# Patient Record
Sex: Female | Born: 2000 | Race: Black or African American | Hispanic: No | Marital: Single
Health system: Midwestern US, Community
[De-identification: ages and names within clinical notes are randomized; demographics above are authoritative.]

## PROBLEM LIST (undated history)

## (undated) DIAGNOSIS — F32A Depression, unspecified: Secondary | ICD-10-CM

## (undated) DIAGNOSIS — R519 Headache, unspecified: Secondary | ICD-10-CM

## (undated) DIAGNOSIS — F419 Anxiety disorder, unspecified: Secondary | ICD-10-CM

## (undated) HISTORY — PX: NO PAST SURGERIES: SHX2092

## (undated) HISTORY — DX: Headache, unspecified: R51.9

## (undated) HISTORY — DX: Depression, unspecified: F32.A

## (undated) HISTORY — DX: Anxiety disorder, unspecified: F41.9

---

## 2019-09-20 ENCOUNTER — Inpatient Hospital Stay
Admit: 2019-09-20 | Discharge: 2019-09-21 | Disposition: A | Discharge status: Home / Self Care | Payer: BLUE CROSS/BLUE SHIELD | Attending: Emergency Medicine

## 2019-09-20 DIAGNOSIS — N3000 Acute cystitis without hematuria: Secondary | ICD-10-CM

## 2019-09-20 LAB — COMPREHENSIVE METABOLIC PANEL
ALT: 20 U/L (ref 12–65)
AST: 27 U/L (ref 15–37)
Albumin/Globulin Ratio: 0.8 — ABNORMAL LOW (ref 1.2–3.5)
Albumin: 3.2 g/dL — ABNORMAL LOW (ref 3.5–5.0)
Alkaline Phosphatase: 38 U/L — ABNORMAL LOW (ref 50–136)
Anion Gap: 9 mmol/L (ref 7–16)
BUN: 7 MG/DL (ref 6–23)
CO2: 20 mmol/L — ABNORMAL LOW (ref 21–32)
Calcium: 9 MG/DL (ref 8.3–10.4)
Chloride: 112 mmol/L — ABNORMAL HIGH (ref 98–107)
Creatinine: 0.86 MG/DL (ref 0.6–1.0)
EGFR IF NonAfrican American: >60 mL/min/{1.73_m2} (ref >60)
GFR African American: >60 mL/min/{1.73_m2} (ref >60)
Globulin: 4 g/dL — ABNORMAL HIGH (ref 2.3–3.5)
Glucose: 78 mg/dL (ref 65–100)
Potassium: 4.3 mmol/L (ref 3.5–5.1)
Sodium: 141 mmol/L (ref 138–145)
Total Bilirubin: 0.5 MG/DL (ref 0.2–1.1)
Total Protein: 7.2 g/dL (ref 6.3–8.2)

## 2019-09-20 LAB — CBC WITH AUTO DIFFERENTIAL
Basophils %: 0 % (ref 0.0–2.0)
Basophils Absolute: 0.1 10*3/uL (ref 0.0–0.2)
Eosinophils %: 0 % — ABNORMAL LOW (ref 0.5–7.8)
Eosinophils Absolute: 0.1 10*3/uL (ref 0.0–0.8)
Granulocyte Absolute Count: 0 10*3/uL (ref 0.0–0.5)
Hematocrit: 38.4 % (ref 35.8–46.3)
Hemoglobin: 12.5 g/dL (ref 11.7–15.4)
Immature Granulocytes: 0 % (ref 0.0–5.0)
Lymphocytes %: 20 % (ref 13–44)
Lymphocytes Absolute: 2.5 10*3/uL (ref 0.5–4.6)
MCH: 29.5 PG (ref 26.1–32.9)
MCHC: 32.6 g/dL (ref 31.4–35.0)
MCV: 90.6 FL (ref 79.6–97.8)
MPV: 10.9 FL (ref 9.4–12.3)
Monocytes %: 6 % (ref 4.0–12.0)
Monocytes Absolute: 0.8 10*3/uL (ref 0.1–1.3)
NRBC Absolute: 0 10*3/uL (ref 0.0–0.2)
Neutrophils %: 72 % (ref 43–78)
Neutrophils Absolute: 8.7 10*3/uL — ABNORMAL HIGH (ref 1.7–8.2)
Platelets: 255 10*3/uL (ref 150–450)
RBC: 4.24 M/uL (ref 4.05–5.2)
RDW: 13.6 % (ref 11.9–14.6)
WBC: 12.1 10*3/uL — ABNORMAL HIGH (ref 4.3–11.1)

## 2019-09-20 LAB — URINALYSIS W/ RFLX MICROSCOPIC
Bilirubin, Urine: NEGATIVE
Bilirubin: NEGATIVE
Blood, Urine: NEGATIVE
Blood: NEGATIVE
Glucose, Ur: NEGATIVE mg/dL
Glucose: NEGATIVE mg/dL
Ketone: 40 mg/dL — AB
Ketones, Urine: 40 mg/dL — AB
Nitrite, Urine: NEGATIVE
Nitrites: NEGATIVE
Specific Gravity, UA: 1.029 — ABNORMAL HIGH (ref 1.001–1.023)
Specific gravity: 1.029 — ABNORMAL HIGH (ref 1.001–1.023)
Urobilinogen, UA, POCT: 1 EU/dL (ref 0.2–1.0)
Urobilinogen: 1 EU/dL (ref 0.2–1.0)
pH (UA): 6 (ref 5.0–9.0)
pH, UA: 6 (ref 5.0–9.0)

## 2019-09-20 LAB — HCG URINE, QL. - POC
HCG, Pregnancy, Urine, POC: NEGATIVE
Pregnancy test,urine (POC): NEGATIVE

## 2019-09-20 LAB — CBC WITH AUTOMATED DIFF
ABS. BASOPHILS: 0.1 10*3/uL (ref 0.0–0.2)
ABS. EOSINOPHILS: 0.1 10*3/uL (ref 0.0–0.8)
ABS. IMM. GRANS.: 0 10*3/uL (ref 0.0–0.5)
ABS. LYMPHOCYTES: 2.5 10*3/uL (ref 0.5–4.6)
ABS. MONOCYTES: 0.8 10*3/uL (ref 0.1–1.3)
ABS. NEUTROPHILS: 8.7 10*3/uL — ABNORMAL HIGH (ref 1.7–8.2)
ABSOLUTE NRBC: 0 10*3/uL (ref 0.0–0.2)
BASOPHILS: 0 % (ref 0.0–2.0)
EOSINOPHILS: 0 % — ABNORMAL LOW (ref 0.5–7.8)
HCT: 38.4 % (ref 35.8–46.3)
HGB: 12.5 g/dL (ref 11.7–15.4)
IMMATURE GRANULOCYTES: 0 % (ref 0.0–5.0)
LYMPHOCYTES: 20 % (ref 13–44)
MCH: 29.5 PG (ref 26.1–32.9)
MCHC: 32.6 g/dL (ref 31.4–35.0)
MCV: 90.6 FL (ref 79.6–97.8)
MONOCYTES: 6 % (ref 4.0–12.0)
MPV: 10.9 FL (ref 9.4–12.3)
NEUTROPHILS: 72 % (ref 43–78)
PLATELET: 255 10*3/uL (ref 150–450)
RBC: 4.24 M/uL (ref 4.05–5.2)
RDW: 13.6 % (ref 11.9–14.6)
WBC: 12.1 10*3/uL — ABNORMAL HIGH (ref 4.3–11.1)

## 2019-09-20 LAB — METABOLIC PANEL, COMPREHENSIVE
A-G Ratio: 0.8 — ABNORMAL LOW (ref 1.2–3.5)
ALT (SGPT): 20 U/L (ref 12–65)
AST (SGOT): 27 U/L (ref 15–37)
Albumin: 3.2 g/dL — ABNORMAL LOW (ref 3.5–5.0)
Alk. phosphatase: 38 U/L — ABNORMAL LOW (ref 50–136)
Anion gap: 9 mmol/L (ref 7–16)
BUN: 7 MG/DL (ref 6–23)
Bilirubin, total: 0.5 MG/DL (ref 0.2–1.1)
CO2: 20 mmol/L — ABNORMAL LOW (ref 21–32)
Calcium: 9 MG/DL (ref 8.3–10.4)
Chloride: 112 mmol/L — ABNORMAL HIGH (ref 98–107)
Creatinine: 0.86 MG/DL (ref 0.6–1.0)
GFR est AA: >60 mL/min/{1.73_m2} (ref >60)
GFR est non-AA: >60 mL/min/{1.73_m2} (ref >60)
Globulin: 4 g/dL — ABNORMAL HIGH (ref 2.3–3.5)
Glucose: 78 mg/dL (ref 65–100)
Potassium: 4.3 mmol/L (ref 3.5–5.1)
Protein, total: 7.2 g/dL (ref 6.3–8.2)
Sodium: 141 mmol/L (ref 138–145)

## 2019-09-20 MED ORDER — AZITHROMYCIN 250 MG TAB
250 mg | ORAL | Status: AC
Start: 2019-09-20 — End: 2019-09-20
  Administered 2019-09-20: via ORAL

## 2019-09-20 MED ORDER — LIDOCAINE HCL 1 % (10 MG/ML) IJ SOLN
10 mg/mL (1 %) | Freq: Once | INTRAMUSCULAR | Status: AC
Start: 2019-09-20 — End: 2019-09-20
  Administered 2019-09-20: via INTRAMUSCULAR

## 2019-09-20 MED ORDER — LIDOCAINE HCL 1 % (10 MG/ML) IJ SOLN
101 mg/mL (1 %) | Freq: Once | INTRAMUSCULAR | Status: DC
Start: 2019-09-20 — End: 2019-09-20

## 2019-09-20 MED FILL — AZITHROMYCIN 250 MG TAB: 250 mg | ORAL | Qty: 4

## 2019-09-20 MED FILL — CEFTRIAXONE 250 MG SOLUTION FOR INJECTION: 250 mg | INTRAMUSCULAR | Qty: 250
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
## 2019-09-20 NOTE — Progress Notes (Signed)
Placed on Keflex.

## 2019-09-20 NOTE — ED Provider Notes (Signed)
ED Provider Notes by Cicero Duck, MD at 09/20/19 1806                Author: Alphonzo Lemmings Rayvon Char, MD  Service: Emergency Medicine  Author Type: Physician       Filed: 09/20/19 1938  Date of Service: 09/20/19 1806  Status: Signed          Editor: Shaynah Hund, Rayvon Char, MD (Physician)               Patient has no significant past medical history, states she has been having abdominal pain for the past 3 days.   She describes it as constant lower nonradiating and worse when she ambulates.  She states she had a call into work today because she was hurting too badly.  She denies any nausea or vomiting, states she has been eating and drinking without difficulty  though she has had a decreased appetite.  She denies any fever, denies any urinary symptoms.  She denies any abnormal vaginal discharge.  She denies similar symptoms in the past, has not taken any medicine for her symptoms.                No past medical history on file.      No past surgical history on file.        No family history on file.        Social History          Socioeconomic History         ?  Marital status:  Not on file              Spouse name:  Not on file         ?  Number of children:  Not on file     ?  Years of education:  Not on file     ?  Highest education level:  Not on file       Occupational History        ?  Not on file       Social Needs         ?  Financial resource strain:  Not on file        ?  Food insecurity              Worry:  Not on file         Inability:  Not on file        ?  Transportation needs              Medical:  Not on file         Non-medical:  Not on file       Tobacco Use         ?  Smoking status:  Not on file       Substance and Sexual Activity         ?  Alcohol use:  Not on file     ?  Drug use:  Not on file     ?  Sexual activity:  Not on file       Lifestyle        ?  Physical activity              Days per week:  Not on file         Minutes per session:  Not on file         ?  Stress:  Not on file        Relationships        ?  Social Engineer, manufacturing systemsconnections              Talks on phone:  Not on file         Gets together:  Not on file         Attends religious service:  Not on file         Active member of club or organization:  Not on file              Attends meetings of clubs or organizations:  Not on file              Relationship status:  Not on file        ?  Intimate partner violence              Fear of current or ex partner:  Not on file         Emotionally abused:  Not on file         Physically abused:  Not on file         Forced sexual activity:  Not on file        Other Topics  Concern        ?  Not on file       Social History Narrative        ?  Not on file              ALLERGIES: Patient has no known allergies.      Review of Systems    Constitutional: Negative for chills and fever.    Gastrointestinal: Negative for nausea and vomiting.    All other systems reviewed and are negative.           Vitals:          09/20/19 1621        BP:  134/77     Pulse:  73     Resp:  16     Temp:  98.1 ??F (36.7 ??C)     SpO2:  98%     Weight:  94.8 kg (209 lb)        Height:  5\' 6"  (1.676 m)                Physical Exam   Vitals signs and nursing note reviewed.   Constitutional:        Appearance: Normal appearance. She is well-developed. She is obese.   HENT:       Head: Normocephalic and atraumatic.   Eyes :       Conjunctiva/sclera: Conjunctivae normal.      Pupils: Pupils are equal, round, and reactive to light.    Neck:       Musculoskeletal: Normal range of motion and neck supple.   Pulmonary:       Effort: Pulmonary effort is normal. No respiratory distress.   Abdominal:      General: Bowel sounds are normal.      Palpations: Abdomen is  soft.      Tenderness: There is abdominal tenderness.            Comments: Mild tenderness to palpation lower abdomen as indicated     Musculoskeletal:          General: No tenderness.    Skin:      General: Skin is warm and dry.  Neurological :       Mental Status: She is alert and  oriented to person, place, and time.    Psychiatric:         Behavior: Behavior normal.              MDM   Number of Diagnoses or Management Options   Acute cystitis without hematuria: new and does not require workup   BV (bacterial vaginosis): new and does not require workup   Diagnosis management comments: 7:38 PM discussed results with patient, need for antibiotics.  Repeat abdominal exam is benign and she appears comfortable.          Amount and/or Complexity of Data Reviewed   Clinical lab tests: ordered and reviewed      Risk of Complications, Morbidity, and/or Mortality   Presenting problems: moderate  Diagnostic procedures: low  Management options: low     Patient Progress   Patient progress: stable             Procedures

## 2019-09-20 NOTE — ED Notes (Signed)
I have reviewed discharge instructions with the patient.  The patient verbalized understanding.    Patient left ED via Discharge Method: ambulatory to Home with (friend).    Opportunity for questions and clarification provided.       Patient given 2 scripts.     No e-sign.    To continue your aftercare when you leave the hospital, you may receive an automated call from our care team to check in on how you are doing.  This is a free service and part of our promise to provide the best care and service to meet your aftercare needs.??? If you have questions, or wish to unsubscribe from this service please call [phone].  Thank you for Choosing our [place] [facility].

## 2019-09-20 NOTE — ED Notes (Signed)
I have reviewed discharge instructions with the patient.  The patient verbalized understanding.    Patient left ED via Discharge Method: ambulatory to Home with friend    Opportunity for questions and clarification provided.       Patient given 2 scripts. No esign        To continue your aftercare when you leave the hospital, you may receive an automated call from our care team to check in on how you are doing.  This is a free service and part of our promise to provide the best care and service to meet your aftercare needs." If you have questions, or wish to unsubscribe from this service please call [phone].  Thank you for Choosing our [facility].

## 2019-09-20 NOTE — ED Notes (Signed)
Ambulatory to triage wearing mask.  C/o lower abdominal pain for the last 2 days.  1 episode of diarrhea yesterday, no nausea, no vomiting.  No currently on menstrual cycle.

## 2019-09-20 NOTE — ED Provider Notes (Signed)
Patient has no significant past medical history, states she has been having abdominal pain for the past 3 days.  She describes it as constant lower nonradiating and worse when she ambulates.  She states she had a call into work today because she was hurting too badly.  She denies any nausea or vomiting, states she has been eating and drinking without difficulty though she has had a decreased appetite.  She denies any fever, denies any urinary symptoms.  She denies any abnormal vaginal discharge.  She denies similar symptoms in the past, has not taken any medicine for her symptoms.           No past medical history on file.    No past surgical history on file.      No family history on file.    Social History     Socioeconomic History   ??? Marital status: Not on file     Spouse name: Not on file   ??? Number of children: Not on file   ??? Years of education: Not on file   ??? Highest education level: Not on file   Occupational History   ??? Not on file   Social Needs   ??? Financial resource strain: Not on file   ??? Food insecurity     Worry: Not on file     Inability: Not on file   ??? Transportation needs     Medical: Not on file     Non-medical: Not on file   Tobacco Use   ??? Smoking status: Not on file   Substance and Sexual Activity   ??? Alcohol use: Not on file   ??? Drug use: Not on file   ??? Sexual activity: Not on file   Lifestyle   ??? Physical activity     Days per week: Not on file     Minutes per session: Not on file   ??? Stress: Not on file   Relationships   ??? Social Product manager on phone: Not on file     Gets together: Not on file     Attends religious service: Not on file     Active member of club or organization: Not on file     Attends meetings of clubs or organizations: Not on file     Relationship status: Not on file   ??? Intimate partner violence     Fear of current or ex partner: Not on file     Emotionally abused: Not on file     Physically abused: Not on file     Forced sexual activity: Not on file    Other Topics Concern   ??? Not on file   Social History Narrative   ??? Not on file         ALLERGIES: Patient has no known allergies.    Review of Systems   Constitutional: Negative for chills and fever.   Gastrointestinal: Negative for nausea and vomiting.   All other systems reviewed and are negative.      Vitals:    09/20/19 1621   BP: 134/77   Pulse: 73   Resp: 16   Temp: 98.1 ??F (36.7 ??C)   SpO2: 98%   Weight: 94.8 kg (209 lb)   Height: 5\' 6"  (1.676 m)            Physical Exam  Vitals signs and nursing note reviewed.   Constitutional:       Appearance:  Normal appearance. She is well-developed. She is obese.   HENT:      Head: Normocephalic and atraumatic.   Eyes:      Conjunctiva/sclera: Conjunctivae normal.      Pupils: Pupils are equal, round, and reactive to light.   Neck:      Musculoskeletal: Normal range of motion and neck supple.   Pulmonary:      Effort: Pulmonary effort is normal. No respiratory distress.   Abdominal:      General: Bowel sounds are normal.      Palpations: Abdomen is soft.      Tenderness: There is abdominal tenderness.          Comments: Mild tenderness to palpation lower abdomen as indicated   Musculoskeletal:         General: No tenderness.   Skin:     General: Skin is warm and dry.   Neurological:      Mental Status: She is alert and oriented to person, place, and time.   Psychiatric:         Behavior: Behavior normal.          MDM  Number of Diagnoses or Management Options  Acute cystitis without hematuria: new and does not require workup  BV (bacterial vaginosis): new and does not require workup  Diagnosis management comments: 7:38 PM discussed results with patient, need for antibiotics.  Repeat abdominal exam is benign and she appears comfortable.       Amount and/or Complexity of Data Reviewed  Clinical lab tests: ordered and reviewed    Risk of Complications, Morbidity, and/or Mortality  Presenting problems: moderate  Diagnostic procedures: low  Management options: low     Patient Progress  Patient progress: stable         Procedures

## 2019-09-20 NOTE — ED Triage Notes (Signed)
Ambulatory to triage wearing mask.  C/o lower abdominal pain for the last 2 days.  1 episode of diarrhea yesterday, no nausea, no vomiting.  No currently on menstrual cycle.

## 2019-09-21 LAB — WET PREP
Wet Prep: >100
Wet Prep: NONE SEEN
Wet Prep: NONE SEEN
Wet prep: >100
Wet prep: NONE SEEN
Wet prep: NONE SEEN

## 2019-09-21 MED ORDER — CEPHALEXIN 500 MG CAP
500 mg | ORAL | Status: AC
Start: 2019-09-21 — End: 2019-09-20
  Administered 2019-09-21: 01:00:00 via ORAL

## 2019-09-21 MED ORDER — CEPHALEXIN 500 MG CAP
500 mg | ORAL_CAPSULE | Freq: Three times a day (TID) | ORAL | 0 refills | Status: AC
Start: 2019-09-21 — End: 2019-09-27

## 2019-09-21 MED ORDER — METRONIDAZOLE 500 MG TAB
500 mg | ORAL_TABLET | Freq: Two times a day (BID) | ORAL | 0 refills | Status: AC
Start: 2019-09-21 — End: 2019-09-27

## 2019-09-21 MED FILL — CEPHALEXIN 500 MG CAP: 500 mg | ORAL | Qty: 1

## 2019-09-22 LAB — CULTURE, URINE
Culture result:: >100000
Culture: >100000

## 2019-09-23 LAB — CHLAMYDIA / GC-AMPLIFIED
CHLAMYDIA TRACHOMATIS, NAA, 188078: NEGATIVE
Chlamydia trachomatis, NAA: NEGATIVE
NEISSERIA GONORRHOEAE, NAA, 188086: POSITIVE — AB
Neisseria gonorrhoeae, NAA: POSITIVE — AB

## 2019-10-26 DIAGNOSIS — S161XXA Strain of muscle, fascia and tendon at neck level, initial encounter: Secondary | ICD-10-CM
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
## 2019-10-26 NOTE — ED Notes (Signed)
I have reviewed discharge instructions with the patient.  The patient verbalized understanding.    Patient left ED via Discharge Method: ambulatory to Home with (insert name of family/friend, self, transport FRIEND).    Opportunity for questions and clarification provided.       Patient given 2 scripts.         To continue your aftercare when you leave the hospital, you may receive an automated call from our care team to check in on how you are doing. This is a free service and part of our promise to provide the best care and service to meet your aftercare needs." If you have questions, or wish to unsubscribe from this service please call [phone]. Thank you for Choosing our [facility] Emergency Department.
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
## 2019-10-26 NOTE — ED Notes (Signed)
I have reviewed discharge instructions with the patient.  The patient verbalized understanding.    Patient left ED via Discharge Method: ambulatory to Home with (insert name of family/friend, self, transport FRIEND).    Opportunity for questions and clarification provided.       Patient given 2 scripts.         To continue your aftercare when you leave the hospital, you may receive an automated call from our care team to check in on how you are doing.?? This is a free service and part of our promise to provide the best care and service to meet your aftercare needs." If you have questions, or wish to unsubscribe from this service please call [phone].?? Thank you for Choosing our [place] [facility].

## 2019-10-27 ENCOUNTER — Inpatient Hospital Stay
Admit: 2019-10-27 | Discharge: 2019-10-27 | Disposition: A | Discharge status: Home / Self Care | Attending: Emergency Medicine

## 2019-10-27 MED ORDER — IBUPROFEN 600 MG TAB
600 mg | ORAL_TABLET | Freq: Four times a day (QID) | ORAL | 0 refills | Status: AC | PRN
Start: 2019-10-27 — End: ?

## 2019-10-27 MED ORDER — METHOCARBAMOL 500 MG TAB
500 mg | ORAL_TABLET | Freq: Three times a day (TID) | ORAL | 0 refills | Status: AC
Start: 2019-10-27 — End: 2019-11-02

## 2019-10-31 ENCOUNTER — Emergency Department: Admit: 2019-10-31 | Primary: Family Medicine

## 2019-10-31 ENCOUNTER — Inpatient Hospital Stay
Admit: 2019-10-31 | Discharge: 2019-10-31 | Disposition: A | Discharge status: Home / Self Care | Attending: Emergency Medicine

## 2019-10-31 DIAGNOSIS — S63501A Unspecified sprain of right wrist, initial encounter: Secondary | ICD-10-CM
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
## 2020-03-20 ENCOUNTER — Inpatient Hospital Stay
Admit: 2020-03-20 | Discharge: 2020-03-20 | Disposition: A | Discharge status: Home / Self Care | Attending: Emergency Medicine

## 2020-03-20 ENCOUNTER — Emergency Department: Admit: 2020-03-20 | Primary: Family Medicine

## 2020-03-20 DIAGNOSIS — L0501 Pilonidal cyst with abscess: Secondary | ICD-10-CM

## 2020-03-20 MED ORDER — LIDOCAINE HCL 1 % (10 MG/ML) IJ SOLN
10 mg/mL (1 %) | Freq: Once | INTRAMUSCULAR | Status: DC
Start: 2020-03-20 — End: 2020-03-20

## 2020-03-20 MED ORDER — HYDROCODONE-ACETAMINOPHEN 7.5 MG-325 MG TAB
ORAL | Status: AC
Start: 2020-03-20 — End: 2020-03-20
  Administered 2020-03-20: 18:00:00 via ORAL

## 2020-03-20 MED ORDER — AMOXICILLIN CLAVULANATE 875 MG-125 MG TAB
875-125 mg | ORAL_TABLET | Freq: Two times a day (BID) | ORAL | 0 refills | Status: AC
Start: 2020-03-20 — End: 2020-03-27

## 2020-03-20 MED ORDER — LIDOCAINE HCL 1 % (10 MG/ML) IJ SOLN
10 mg/mL (1 %) | INTRAMUSCULAR | Status: DC
Start: 2020-03-20 — End: 2020-03-20

## 2020-03-20 MED FILL — HYDROCODONE-ACETAMINOPHEN 7.5 MG-325 MG TAB: ORAL | Qty: 1

## 2020-03-20 MED FILL — XYLOCAINE 10 MG/ML (1 %) INJECTION SOLUTION: 10 mg/mL (1 %) | INTRAMUSCULAR | Qty: 20

## 2020-03-20 NOTE — ED Notes (Signed)
I have reviewed discharge instructions with the patient.  The patient verbalized understanding.    Patient left ED via Discharge Method: ambulatory to Home with (insert name of family/friend, self, transport FRIEND).    Opportunity for questions and clarification provided.       Patient given 1 scripts.         To continue your aftercare when you leave the hospital, you may receive an automated call from our care team to check in on how you are doing. This is a free service and part of our promise to provide the best care and service to meet your aftercare needs." If you have questions, or wish to unsubscribe from this service please call [phone]. Thank you for Choosing our [facility] Emergency Department.

## 2020-03-20 NOTE — ED Notes (Signed)
I have reviewed discharge instructions with the patient.  The patient verbalized understanding.    Patient left ED via Discharge Method: ambulatory to Home with (insert name of family/friend, self, transport FRIEND).    Opportunity for questions and clarification provided.       Patient given 1 scripts.         To continue your aftercare when you leave the hospital, you may receive an automated call from our care team to check in on how you are doing.?? This is a free service and part of our promise to provide the best care and service to meet your aftercare needs." If you have questions, or wish to unsubscribe from this service please call [phone].?? Thank you for Choosing our [facility].
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
## 2020-03-20 NOTE — ED Provider Notes (Addendum)
19 year old female who presents to the emergency department today with complaint of sacral pain.  She reports pain began a couple of days ago and has progressively gotten worse.  She states that she thought she "dislocated her coccyx."  Patient denies any falls or trauma.  She denies any fever, fatigue, dysuria, hematuria, leg weakness, numbness, nausea, vomiting, diarrhea, pelvic pain, vaginal discharge, vaginal bleeding.    The history is provided by the patient.        No past medical history on file.    No past surgical history on file.      No family history on file.    Social History     Socioeconomic History   ??? Marital status: SINGLE     Spouse name: Not on file   ??? Number of children: Not on file   ??? Years of education: Not on file   ??? Highest education level: Not on file   Occupational History   ??? Not on file   Tobacco Use   ??? Smoking status: Not on file   Substance and Sexual Activity   ??? Alcohol use: Not on file   ??? Drug use: Not on file   ??? Sexual activity: Not on file   Other Topics Concern   ??? Not on file   Social History Narrative   ??? Not on file     Social Determinants of Health     Financial Resource Strain:    ??? Difficulty of Paying Living Expenses:    Food Insecurity:    ??? Worried About Programme researcher, broadcasting/film/video in the Last Year:    ??? Barista in the Last Year:    Transportation Needs:    ??? Freight forwarder (Medical):    ??? Lack of Transportation (Non-Medical):    Physical Activity:    ??? Days of Exercise per Week:    ??? Minutes of Exercise per Session:    Stress:    ??? Feeling of Stress :    Social Connections:    ??? Frequency of Communication with Friends and Family:    ??? Frequency of Social Gatherings with Friends and Family:    ??? Attends Religious Services:    ??? Database administrator or Organizations:    ??? Attends Engineer, structural:    ??? Marital Status:    Intimate Programme researcher, broadcasting/film/video Violence:    ??? Fear of Current or Ex-Partner:    ??? Emotionally Abused:    ??? Physically Abused:    ???  Sexually Abused:          ALLERGIES: Patient has no known allergies.    Review of Systems   Constitutional: Negative for chills, fatigue and fever.   Gastrointestinal: Negative for abdominal pain, diarrhea, nausea and vomiting.   Musculoskeletal: Positive for back pain.        Sacral pain   Neurological: Negative for dizziness, weakness and numbness.   All other systems reviewed and are negative.      Vitals:    03/20/20 1240   BP: (!) 101/57   Pulse: 88   Resp: 18   Temp: 98.9 ??F (37.2 ??C)   SpO2: 100%   Weight: 81.6 kg (180 lb)   Height: 5\' 6"  (1.676 m)            Physical Exam  Vitals and nursing note reviewed.   Constitutional:       General: She is not in acute distress.  Appearance: Normal appearance. She is normal weight. She is not ill-appearing, toxic-appearing or diaphoretic.   HENT:      Head: Normocephalic and atraumatic.      Right Ear: External ear normal.      Left Ear: External ear normal.      Nose: Nose normal. No congestion.      Mouth/Throat:      Mouth: Mucous membranes are moist.      Pharynx: Oropharynx is clear.   Eyes:      General: No scleral icterus.     Extraocular Movements: Extraocular movements intact.      Conjunctiva/sclera: Conjunctivae normal.   Cardiovascular:      Rate and Rhythm: Normal rate.      Pulses: Normal pulses.   Pulmonary:      Effort: Pulmonary effort is normal. No respiratory distress.   Abdominal:      General: Abdomen is flat. There is no distension.      Palpations: Abdomen is soft.      Tenderness: There is no abdominal tenderness.   Musculoskeletal:         General: Normal range of motion.      Cervical back: Normal range of motion and neck supple.   Skin:     General: Skin is warm and dry.      Capillary Refill: Capillary refill takes less than 2 seconds.      Findings: Abscess present.             Comments: Proximately 2 cm x 2 cm abscess to right gluteal cleft.  Center area of fluctuance.  No drainage, erythema noted.   Neurological:      General: No focal  deficit present.      Mental Status: She is alert and oriented to person, place, and time.   Psychiatric:         Mood and Affect: Mood normal.         Behavior: Behavior normal.         Thought Content: Thought content normal.         Judgment: Judgment normal.          MDM  Number of Diagnoses or Management Options  Pilonidal abscess  Diagnosis management comments: 19 year old female presented today for complaint of sacral pain.  Noted on exam approximately 2 cm round area to right gluteal cleft presumed to be pilonidal abscess.  Incision and drainage of abscess with copious amounts of purulent malodorous drainage.  Patient tolerated well.  Patient educated on wound care.  Augmentin prescribed.  Patient provided with information for general surgery should this not improve or return. I have discussed the results of radiographs, and/or treatments with the patient and available family members. ??Treatment plan is agreed upon by the patient and the patient is ready for discharge. ??Questions about treatment in the ED and differential diagnosis of presenting condition were answered. ??Patient was given verbal discharge instructions including, but not limited to, importance of returning to the emergency department for any concern of worsening or continued symptoms. ??Instructions were given to follow up with a primary care provider or specialist within 1-2 days. ??Adverse effects of medications, if prescribed, were discussed.       Amount and/or Complexity of Data Reviewed  Tests in the radiology section of CPT??: reviewed    Risk of Complications, Morbidity, and/or Mortality  Presenting problems: moderate  Diagnostic procedures: moderate  Management options: moderate    Patient Progress  Patient progress:  improved    ED Course as of Mar 20 1404   Thu Mar 20, 2020   1353 IMPRESSION  Normal sacrum.    XR SACRUM AND COCCYX [BC]      ED Course User Index  [BC] Lucille Passy, NP       I&D Abcess Simple    Date/Time: 03/20/2020  2:55 PM  Performed by: Lucille Passy, NP  Authorized by: Lucille Passy, NP     Consent:     Consent obtained:  Verbal    Consent given by:  Patient    Risks discussed:  Bleeding, pain and incomplete drainage    Alternatives discussed:  Alternative treatment  Location:     Type:  Abscess    Size:  2 cm x 2 cm    Location:  Anogenital    Anogenital location:  Pilonidal  Pre-procedure details:     Skin preparation:  Betadine  Anesthesia (see MAR for exact dosages):     Anesthesia method:  Local infiltration    Local anesthetic:  Lidocaine 1% w/o epi  Procedure type:     Complexity:  Simple  Procedure details:     Needle aspiration: no      Incision types:  Single straight    Incision depth:  Dermal    Scalpel blade:  11    Wound management:  Probed and deloculated and irrigated with saline    Drainage:  Purulent    Drainage amount:  Copious    Wound treatment:  Wound left open    Packing materials:  None  Post-procedure details:     Patient tolerance of procedure:  Tolerated well, no immediate complications

## 2020-03-20 NOTE — ED Provider Notes (Signed)
ED Provider Notes by Stephannie Peters, NP at 03/20/20 1328                Author: Stephannie Peters, NP  Service: Emergency Medicine  Author Type: Nurse Practitioner       Filed: 03/20/20 1458  Date of Service: 03/20/20 1328  Status: Attested Addendum          Editor: Stephannie Peters, NP (Nurse Practitioner)       Related Notes: Original Note by Stephannie Peters, NP (Nurse Practitioner) filed at 03/20/20 1458          Cosigner: Synetta Fail, MD at 03/21/20 0901            Procedure Orders        1. I&D Abcess Simple [[id number]] ordered by Stephannie Peters, NP                         Attestation signed by Synetta Fail, MD at 03/21/20 0901          I was available for consultation but I did not personally evaluate nor did I direct the care of this patient                                 19 year old female who presents to the emergency department today with complaint of sacral pain.  She reports pain  began a couple of days ago and has progressively gotten worse.  She states that she thought she "dislocated her coccyx."  Patient denies any falls or trauma.  She denies any fever, fatigue, dysuria, hematuria, leg weakness, numbness, nausea, vomiting,  diarrhea, pelvic pain, vaginal discharge, vaginal bleeding.      The history is provided by the patient.           No past medical history on file.      No past surgical history on file.        No family history on file.        Social History          Socioeconomic History         ?  Marital status:  SINGLE              Spouse name:  Not on file         ?  Number of children:  Not on file     ?  Years of education:  Not on file     ?  Highest education level:  Not on file       Occupational History        ?  Not on file       Tobacco Use         ?  Smoking status:  Not on file       Substance and Sexual Activity         ?  Alcohol use:  Not on file     ?  Drug use:  Not on file     ?  Sexual activity:  Not on file        Other Topics  Concern        ?  Not on file        Social History Narrative        ?  Not on file          Social Determinants of  Health          Financial Resource Strain:         ?  Difficulty of Paying Living Expenses:        Food Insecurity:         ?  Worried About Programme researcher, broadcasting/film/video in the Last Year:      ?  Barista in the Last Year:        Transportation Needs:         ?  Freight forwarder (Medical):      ?  Lack of Transportation (Non-Medical):        Physical Activity:         ?  Days of Exercise per Week:      ?  Minutes of Exercise per Session:        Stress:         ?  Feeling of Stress :        Social Connections:         ?  Frequency of Communication with Friends and Family:      ?  Frequency of Social Gatherings with Friends and Family:      ?  Attends Religious Services:      ?  Active Member of Clubs or Organizations:      ?  Attends Banker Meetings:      ?  Marital Status:        Intimate Partner Violence:         ?  Fear of Current or Ex-Partner:      ?  Emotionally Abused:      ?  Physically Abused:         ?  Sexually Abused:               ALLERGIES: Patient has no known allergies.      Review of Systems    Constitutional: Negative for chills, fatigue and fever.    Gastrointestinal: Negative for abdominal pain, diarrhea, nausea and vomiting.    Musculoskeletal: Positive for back pain.         Sacral pain    Neurological: Negative for dizziness, weakness and numbness.    All other systems reviewed and are negative.           Vitals:          03/20/20 1240        BP:  (!) 101/57     Pulse:  88     Resp:  18     Temp:  98.9 ??F (37.2 ??C)     SpO2:  100%     Weight:  81.6 kg (180 lb)        Height:  5\' 6"  (1.676 m)                Physical Exam   Vitals and nursing note reviewed.   Constitutional:        General: She is not in acute distress.     Appearance: Normal appearance. She is normal weight. She is not ill-appearing, toxic-appearing or diaphoretic.   HENT:       Head: Normocephalic and atraumatic.      Right  Ear: External ear normal.      Left Ear: External ear normal.      Nose: Nose normal. No congestion.      Mouth/Throat:      Mouth: Mucous membranes are moist.  Pharynx: Oropharynx  is clear.   Eyes :       General: No scleral icterus.     Extraocular Movements: Extraocular movements intact.      Conjunctiva/sclera: Conjunctivae normal.   Cardiovascular :       Rate and Rhythm: Normal rate.      Pulses: Normal pulses.    Pulmonary:       Effort: Pulmonary effort is normal. No respiratory distress.   Abdominal:      General: Abdomen is flat. There is no distension.      Palpations:  Abdomen is soft.      Tenderness: There is no abdominal tenderness.     Musculoskeletal:          General: Normal range of motion.      Cervical back: Normal range of motion and neck supple.    Skin:      General: Skin is warm and dry.      Capillary Refill: Capillary refill takes less than 2 seconds.      Findings: Abscess present.                Comments: Proximately 2 cm x 2 cm abscess to right gluteal cleft.  Center area of fluctuance.  No drainage, erythema noted.    Neurological :       General: No focal deficit present.      Mental Status: She is alert and oriented to person, place, and time.    Psychiatric:         Mood and Affect: Mood normal.         Behavior: Behavior normal.         Thought Content: Thought content normal.         Judgment: Judgment normal.              MDM   Number of Diagnoses or Management Options   Pilonidal abscess   Diagnosis management comments: 19 year old female presented today for complaint of sacral pain.  Noted on exam approximately 2 cm round area to right gluteal cleft presumed to be pilonidal abscess.   Incision and drainage of abscess with copious amounts of purulent malodorous drainage.  Patient tolerated well.  Patient educated on wound care.  Augmentin prescribed.  Patient provided with information for general surgery should this not improve or  return. I have discussed the results of  radiographs, and/or treatments with the patient and available family members. ??Treatment plan is agreed upon by the patient and the patient is ready for discharge. ??Questions about treatment in the ED and  differential diagnosis of presenting condition were answered. ??Patient was given verbal discharge instructions including, but not limited to, importance of returning to the emergency department for any concern of worsening or continued symptoms. ??Instructions  were given to follow up with a primary care provider or specialist within 1-2 days. ??Adverse effects of medications, if prescribed, were discussed.          Amount and/or Complexity of Data Reviewed   Tests in the radiology section of CPT??: reviewed      Risk of Complications, Morbidity, and/or Mortality   Presenting problems: moderate  Diagnostic procedures: moderate  Management options: moderate     Patient Progress   Patient progress: improved        ED Course as of Mar 20 1404       Thu Mar 20, 2020        1353  IMPRESSION  Normal sacrum.     XR SACRUM AND COCCYX [BC]              ED Course User Index   [BC] Lucille Passy, NP           I&D Abcess Simple      Date/Time: 03/20/2020 2:55 PM   Performed by:  Lucille Passy, NP   Authorized by:  Lucille Passy, NP       Consent:      Consent obtained:  Verbal     Consent given by:  Patient     Risks discussed:  Bleeding, pain and incomplete drainage     Alternatives discussed:  Alternative treatment   Location:      Type:  Abscess     Size:  2 cm x 2 cm     Location:  Anogenital     Anogenital location:  Pilonidal   Pre-procedure details:      Skin preparation:  Betadine   Anesthesia (see MAR for exact dosages):      Anesthesia method:  Local infiltration     Local anesthetic:  Lidocaine 1% w/o epi   Procedure type:      Complexity:  Simple   Procedure details:     Needle aspiration: no       Incision types:  Single straight     Incision depth:  Dermal     Scalpel blade:  11     Wound management:  Probed and  deloculated and irrigated with saline     Drainage:  Purulent     Drainage amount:  Copious     Wound treatment:  Wound left open     Packing materials:  None   Post-procedure details:      Patient tolerance of procedure:  Tolerated well, no immediate complications

## 2020-03-20 NOTE — ED Triage Notes (Signed)
Pt arrives by EMS from home with CC of fall. Denies LOC, or hitting head. Pt reports 10/10 pain to coccyx. Pt has hx of previous coccyx fracture.

## 2020-06-30 ENCOUNTER — Other Ambulatory Visit: Payer: Self-pay

## 2020-06-30 ENCOUNTER — Ambulatory Visit (INDEPENDENT_AMBULATORY_CARE_PROVIDER_SITE_OTHER): Payer: Medicaid Other

## 2020-06-30 DIAGNOSIS — Z3490 Encounter for supervision of normal pregnancy, unspecified, unspecified trimester: Secondary | ICD-10-CM | POA: Insufficient documentation

## 2020-06-30 DIAGNOSIS — N912 Amenorrhea, unspecified: Secondary | ICD-10-CM | POA: Diagnosis not present

## 2020-06-30 DIAGNOSIS — O219 Vomiting of pregnancy, unspecified: Secondary | ICD-10-CM

## 2020-06-30 LAB — POCT URINE PREGNANCY: Preg Test, Ur: POSITIVE — AB

## 2020-06-30 MED ORDER — BLOOD PRESSURE MONITOR KIT: 1.0000 | PACK | 0 refills | Status: DC

## 2020-06-30 MED ORDER — PROMETHAZINE HCL 25 MG PO TABS: 25.0000 mg | ORAL_TABLET | Freq: Four times a day (QID) | ORAL | 0 refills | Status: DC | PRN

## 2020-06-30 NOTE — Progress Notes (Signed)
Ms. Meath presents today for UPT. She  nausea . LMP: pt noted to have irregular periods  Usually at the end of the month. No cycle since May had spotting.Unsire of dates.      OBJECTIVE: Appears well, in no apparent distress.  OB History   No obstetric history on file.    Home UPT Result: Positive  In-Office UPT result: POSITIVE  I have reviewed the patient's medical, obstetrical, social, and family histories, and medications.   ASSESSMENT: Positive pregnancy test  PLAN Prenatal care to be completed at: [facility]   NOB Intake Completed  B/P Cuff Sent  Rx for Nausea Discussed and sent per protocol

## 2020-06-30 NOTE — Progress Notes (Signed)
Patient was assessed and managed by nursing staff during this encounter. I have reviewed the chart and agree with the documentation and plan. I have also made any necessary editorial changes.  Catalina Antigua, MD 06/30/2020 2:44 PM

## 2020-07-10 ENCOUNTER — Encounter: Payer: Self-pay | Admitting: Obstetrics and Gynecology

## 2020-07-10 ENCOUNTER — Ambulatory Visit (INDEPENDENT_AMBULATORY_CARE_PROVIDER_SITE_OTHER): Payer: Medicaid Other | Admitting: Obstetrics and Gynecology

## 2020-07-10 ENCOUNTER — Other Ambulatory Visit: Payer: Self-pay

## 2020-07-10 ENCOUNTER — Other Ambulatory Visit (HOSPITAL_COMMUNITY)
Admission: RE | Admit: 2020-07-10 | Discharge: 2020-07-10 | Disposition: A | Discharge status: Home / Self Care | Payer: Medicaid Other | Source: Ambulatory Visit | Attending: Nurse Practitioner | Admitting: Nurse Practitioner

## 2020-07-10 VITALS — BP 118/75 | HR 83 | Wt 167.9 lb

## 2020-07-10 DIAGNOSIS — Z23 Encounter for immunization: Secondary | ICD-10-CM | POA: Diagnosis not present

## 2020-07-10 DIAGNOSIS — Z3A18 18 weeks gestation of pregnancy: Secondary | ICD-10-CM | POA: Diagnosis not present

## 2020-07-10 DIAGNOSIS — A749 Chlamydial infection, unspecified: Secondary | ICD-10-CM

## 2020-07-10 DIAGNOSIS — Z3492 Encounter for supervision of normal pregnancy, unspecified, second trimester: Secondary | ICD-10-CM | POA: Diagnosis not present

## 2020-07-10 DIAGNOSIS — Z3490 Encounter for supervision of normal pregnancy, unspecified, unspecified trimester: Secondary | ICD-10-CM | POA: Diagnosis not present

## 2020-07-10 DIAGNOSIS — O98812 Other maternal infectious and parasitic diseases complicating pregnancy, second trimester: Secondary | ICD-10-CM

## 2020-07-10 MED ORDER — CYCLOBENZAPRINE HCL 10 MG PO TABS: 10.0000 mg | ORAL_TABLET | Freq: Three times a day (TID) | ORAL | 2 refills | Status: DC | PRN

## 2020-07-10 MED ORDER — COMFORT FIT MATERNITY SUPP MED MISC: 0 refills | Status: DC

## 2020-07-10 NOTE — Addendum Note (Signed)
Addended by: Kennon Portela on: 07/10/2020 01:14 PM   Modules accepted: Orders

## 2020-07-10 NOTE — Progress Notes (Signed)
NOB  Last pap: N/A due to age  Genetic Screening: Desires Flu Vaccine Today  *Pt has questions about getting Tattoo while pregnant.    CC: Constant back taking tylenol but no relief.

## 2020-07-10 NOTE — Patient Instructions (Signed)
 Second Trimester of Pregnancy The second trimester is from week 14 through week 27 (months 4 through 6). The second trimester is often a time when you feel your best. Your body has adjusted to being pregnant, and you begin to feel better physically. Usually, morning sickness has lessened or quit completely, you may have more energy, and you may have an increase in appetite. The second trimester is also a time when the fetus is growing rapidly. At the end of the sixth month, the fetus is about 9 inches long and weighs about 1 pounds. You will likely begin to feel the baby move (quickening) between 16 and 20 weeks of pregnancy. Body changes during your second trimester Your body continues to go through many changes during your second trimester. The changes vary from woman to woman.  Your weight will continue to increase. You will notice your lower abdomen bulging out.  You may begin to get stretch marks on your hips, abdomen, and breasts.  You may develop headaches that can be relieved by medicines. The medicines should be approved by your health care provider.  You may urinate more often because the fetus is pressing on your bladder.  You may develop or continue to have heartburn as a result of your pregnancy.  You may develop constipation because certain hormones are causing the muscles that push waste through your intestines to slow down.  You may develop hemorrhoids or swollen, bulging veins (varicose veins).  You may have back pain. This is caused by: ? Weight gain. ? Pregnancy hormones that are relaxing the joints in your pelvis. ? A shift in weight and the muscles that support your balance.  Your breasts will continue to grow and they will continue to become tender.  Your gums may bleed and may be sensitive to brushing and flossing.  Dark spots or blotches (chloasma, mask of pregnancy) may develop on your face. This will likely fade after the baby is born.  A dark line from  your belly button to the pubic area (linea nigra) may appear. This will likely fade after the baby is born.  You may have changes in your hair. These can include thickening of your hair, rapid growth, and changes in texture. Some women also have hair loss during or after pregnancy, or hair that feels dry or thin. Your hair will most likely return to normal after your baby is born. What to expect at prenatal visits During a routine prenatal visit:  You will be weighed to make sure you and the fetus are growing normally.  Your blood pressure will be taken.  Your abdomen will be measured to track your baby's growth.  The fetal heartbeat will be listened to.  Any test results from the previous visit will be discussed. Your health care provider may ask you:  How you are feeling.  If you are feeling the baby move.  If you have had any abnormal symptoms, such as leaking fluid, bleeding, severe headaches, or abdominal cramping.  If you are using any tobacco products, including cigarettes, chewing tobacco, and electronic cigarettes.  If you have any questions. Other tests that may be performed during your second trimester include:  Blood tests that check for: ? Low iron levels (anemia). ? High blood sugar that affects pregnant women (gestational diabetes) between 24 and 28 weeks. ? Rh antibodies. This is to check for a protein on red blood cells (Rh factor).  Urine tests to check for infections, diabetes, or protein in   the urine.  An ultrasound to confirm the proper growth and development of the baby.  An amniocentesis to check for possible genetic problems.  Fetal screens for spina bifida and Down syndrome.  HIV (human immunodeficiency virus) testing. Routine prenatal testing includes screening for HIV, unless you choose not to have this test. Follow these instructions at home: Medicines  Follow your health care provider's instructions regarding medicine use. Specific medicines  may be either safe or unsafe to take during pregnancy.  Take a prenatal vitamin that contains at least 600 micrograms (mcg) of folic acid.  If you develop constipation, try taking a stool softener if your health care provider approves. Eating and drinking   Eat a balanced diet that includes fresh fruits and vegetables, whole grains, good sources of protein such as meat, eggs, or tofu, and low-fat dairy. Your health care provider will help you determine the amount of weight gain that is right for you.  Avoid raw meat and uncooked cheese. These carry germs that can cause birth defects in the baby.  If you have low calcium intake from food, talk to your health care provider about whether you should take a daily calcium supplement.  Limit foods that are high in fat and processed sugars, such as fried and sweet foods.  To prevent constipation: ? Drink enough fluid to keep your urine clear or pale yellow. ? Eat foods that are high in fiber, such as fresh fruits and vegetables, whole grains, and beans. Activity  Exercise only as directed by your health care provider. Most women can continue their usual exercise routine during pregnancy. Try to exercise for 30 minutes at least 5 days a week. Stop exercising if you experience uterine contractions.  Avoid heavy lifting, wear low heel shoes, and practice good posture.  A sexual relationship may be continued unless your health care provider directs you otherwise. Relieving pain and discomfort  Wear a good support bra to prevent discomfort from breast tenderness.  Take warm sitz baths to soothe any pain or discomfort caused by hemorrhoids. Use hemorrhoid cream if your health care provider approves.  Rest with your legs elevated if you have leg cramps or low back pain.  If you develop varicose veins, wear support hose. Elevate your feet for 15 minutes, 3-4 times a day. Limit salt in your diet. Prenatal Care  Write down your questions. Take  them to your prenatal visits.  Keep all your prenatal visits as told by your health care provider. This is important. Safety  Wear your seat belt at all times when driving.  Make a list of emergency phone numbers, including numbers for family, friends, the hospital, and police and fire departments. General instructions  Ask your health care provider for a referral to a local prenatal education class. Begin classes no later than the beginning of month 6 of your pregnancy.  Ask for help if you have counseling or nutritional needs during pregnancy. Your health care provider can offer advice or refer you to specialists for help with various needs.  Do not use hot tubs, steam rooms, or saunas.  Do not douche or use tampons or scented sanitary pads.  Do not cross your legs for long periods of time.  Avoid cat litter boxes and soil used by cats. These carry germs that can cause birth defects in the baby and possibly loss of the fetus by miscarriage or stillbirth.  Avoid all smoking, herbs, alcohol, and unprescribed drugs. Chemicals in these products can affect the   formation and growth of the baby.  Do not use any products that contain nicotine or tobacco, such as cigarettes and e-cigarettes. If you need help quitting, ask your health care provider.  Visit your dentist if you have not gone yet during your pregnancy. Use a soft toothbrush to brush your teeth and be gentle when you floss. Contact a health care provider if:  You have dizziness.  You have mild pelvic cramps, pelvic pressure, or nagging pain in the abdominal area.  You have persistent nausea, vomiting, or diarrhea.  You have a bad smelling vaginal discharge.  You have pain when you urinate. Get help right away if:  You have a fever.  You are leaking fluid from your vagina.  You have spotting or bleeding from your vagina.  You have severe abdominal cramping or pain.  You have rapid weight gain or weight loss.  You  have shortness of breath with chest pain.  You notice sudden or extreme swelling of your face, hands, ankles, feet, or legs.  You have not felt your baby move in over an hour.  You have severe headaches that do not go away when you take medicine.  You have vision changes. Summary  The second trimester is from week 14 through week 27 (months 4 through 6). It is also a time when the fetus is growing rapidly.  Your body goes through many changes during pregnancy. The changes vary from woman to woman.  Avoid all smoking, herbs, alcohol, and unprescribed drugs. These chemicals affect the formation and growth your baby.  Do not use any tobacco products, such as cigarettes, chewing tobacco, and e-cigarettes. If you need help quitting, ask your health care provider.  Contact your health care provider if you have any questions. Keep all prenatal visits as told by your health care provider. This is important. This information is not intended to replace advice given to you by your health care provider. Make sure you discuss any questions you have with your health care provider. Document Revised: 01/26/2019 Document Reviewed: 11/09/2016 [employer] Patient Education  2020 [employer]   Contraception Choices Contraception, also called birth control, refers to methods or devices that prevent pregnancy. Hormonal methods Contraceptive implant  A contraceptive implant is a thin, plastic tube that contains a hormone. It is inserted into the upper part of the arm. It can remain in place for up to 3 years. Progestin-only injections Progestin-only injections are injections of progestin, a synthetic form of the hormone progesterone. They are given every 3 months by a health care provider. Birth control pills  Birth control pills are pills that contain hormones that prevent pregnancy. They must be taken once a day, preferably at the same time each day. Birth control patch  The birth control patch  contains hormones that prevent pregnancy. It is placed on the skin and must be changed once a week for three weeks and removed on the fourth week. A prescription is needed to use this method of contraception. Vaginal ring  A vaginal ring contains hormones that prevent pregnancy. It is placed in the vagina for three weeks and removed on the fourth week. After that, the process is repeated with a new ring. A prescription is needed to use this method of contraception. Emergency contraceptive Emergency contraceptives prevent pregnancy after unprotected sex. They come in pill form and can be taken up to 5 days after sex. They work best the sooner they are taken after having sex. Most emergency contraceptives are available   without a prescription. This method should not be used as your only form of birth control. Barrier methods Female condom  A female condom is a thin sheath that is worn over the penis during sex. Condoms keep sperm from going inside a woman's body. They can be used with a spermicide to increase their effectiveness. They should be disposed after a single use. Female condom  A female condom is a soft, loose-fitting sheath that is put into the vagina before sex. The condom keeps sperm from going inside a woman's body. They should be disposed after a single use. Diaphragm  A diaphragm is a soft, dome-shaped barrier. It is inserted into the vagina before sex, along with a spermicide. The diaphragm blocks sperm from entering the uterus, and the spermicide kills sperm. A diaphragm should be left in the vagina for 6-8 hours after sex and removed within 24 hours. A diaphragm is prescribed and fitted by a health care provider. A diaphragm should be replaced every 1-2 years, after giving birth, after gaining more than 15 lb (6.8 kg), and after pelvic surgery. Cervical cap  A cervical cap is a round, soft latex or plastic cup that fits over the cervix. It is inserted into the vagina before sex, along  with spermicide. It blocks sperm from entering the uterus. The cap should be left in place for 6-8 hours after sex and removed within 48 hours. A cervical cap must be prescribed and fitted by a health care provider. It should be replaced every 2 years. Sponge  A sponge is a soft, circular piece of polyurethane foam with spermicide on it. The sponge helps block sperm from entering the uterus, and the spermicide kills sperm. To use it, you make it wet and then insert it into the vagina. It should be inserted before sex, left in for at least 6 hours after sex, and removed and thrown away within 30 hours. Spermicides Spermicides are chemicals that kill or block sperm from entering the cervix and uterus. They can come as a cream, jelly, suppository, foam, or tablet. A spermicide should be inserted into the vagina with an applicator at least 10-15 minutes before sex to allow time for it to work. The process must be repeated every time you have sex. Spermicides do not require a prescription. Intrauterine contraception Intrauterine device (IUD) An IUD is a T-shaped device that is put in a woman's uterus. There are two types:  Hormone IUD.This type contains progestin, a synthetic form of the hormone progesterone. This type can stay in place for 3-5 years.  Copper IUD.This type is wrapped in copper wire. It can stay in place for 10 years.  Permanent methods of contraception Female tubal ligation In this method, a woman's fallopian tubes are sealed, tied, or blocked during surgery to prevent eggs from traveling to the uterus. Hysteroscopic sterilization In this method, a small, flexible insert is placed into each fallopian tube. The inserts cause scar tissue to form in the fallopian tubes and block them, so sperm cannot reach an egg. The procedure takes about 3 months to be effective. Another form of birth control must be used during those 3 months. Female sterilization This is a procedure to tie off the  tubes that carry sperm (vasectomy). After the procedure, the man can still ejaculate fluid (semen). Natural planning methods Natural family planning In this method, a couple does not have sex on days when the woman could become pregnant. Calendar method This means keeping track of the length   of each menstrual cycle, identifying the days when pregnancy can happen, and not having sex on those days. Ovulation method In this method, a couple avoids sex during ovulation. Symptothermal method This method involves not having sex during ovulation. The woman typically checks for ovulation by watching changes in her temperature and in the consistency of cervical mucus. Post-ovulation method In this method, a couple waits to have sex until after ovulation. Summary  Contraception, also called birth control, means methods or devices that prevent pregnancy.  Hormonal methods of contraception include implants, injections, pills, patches, vaginal rings, and emergency contraceptives.  Barrier methods of contraception can include female condoms, female condoms, diaphragms, cervical caps, sponges, and spermicides.  There are two types of IUDs (intrauterine devices). An IUD can be put in a woman's uterus to prevent pregnancy for 3-5 years.  Permanent sterilization can be done through a procedure for males, females, or both.  Natural family planning methods involve not having sex on days when the woman could become pregnant. This information is not intended to replace advice given to you by your health care provider. Make sure you discuss any questions you have with your health care provider. Document Revised: 10/06/2017 Document Reviewed: 11/06/2016 [employer] Patient Education  2020 [employer]   Breastfeeding  Choosing to breastfeed is one of the best decisions you can make for yourself and your baby. A change in hormones during pregnancy causes your breasts to make breast milk in your milk-producing  glands. Hormones prevent breast milk from being released before your baby is born. They also prompt milk flow after birth. Once breastfeeding has begun, thoughts of your baby, as well as his or her sucking or crying, can stimulate the release of milk from your milk-producing glands. Benefits of breastfeeding Research shows that breastfeeding offers many health benefits for infants and mothers. It also offers a cost-free and convenient way to feed your baby. For your baby  Your first milk (colostrum) helps your baby's digestive system to function better.  Special cells in your milk (antibodies) help your baby to fight off infections.  Breastfed babies are less likely to develop asthma, allergies, obesity, or type 2 diabetes. They are also at lower risk for sudden infant death syndrome (SIDS).  Nutrients in breast milk are better able to meet your baby's needs compared to infant formula.  Breast milk improves your baby's brain development. For you  Breastfeeding helps to create a very special bond between you and your baby.  Breastfeeding is convenient. Breast milk costs nothing and is always available at the correct temperature.  Breastfeeding helps to burn calories. It helps you to lose the weight that you gained during pregnancy.  Breastfeeding makes your uterus return faster to its size before pregnancy. It also slows bleeding (lochia) after you give birth.  Breastfeeding helps to lower your risk of developing type 2 diabetes, osteoporosis, rheumatoid arthritis, cardiovascular disease, and breast, ovarian, uterine, and endometrial cancer later in life. Breastfeeding basics Starting breastfeeding  Find a comfortable place to sit or lie down, with your neck and back well-supported.  Place a pillow or a rolled-up blanket under your baby to bring him or her to the level of your breast (if you are seated). Nursing pillows are specially designed to help support your arms and your baby while  you breastfeed.  Make sure that your baby's tummy (abdomen) is facing your abdomen.  Gently massage your breast. With your fingertips, massage from the outer edges of your breast inward toward   the nipple. This encourages milk flow. If your milk flows slowly, you may need to continue this action during the feeding.  Support your breast with 4 fingers underneath and your thumb above your nipple (make the letter "C" with your hand). Make sure your fingers are well away from your nipple and your baby's mouth.  Stroke your baby's lips gently with your finger or nipple.  When your baby's mouth is open wide enough, quickly bring your baby to your breast, placing your entire nipple and as much of the areola as possible into your baby's mouth. The areola is the colored area around your nipple. ? More areola should be visible above your baby's upper lip than below the lower lip. ? Your baby's lips should be opened and extended outward (flanged) to ensure an adequate, comfortable latch. ? Your baby's tongue should be between his or her lower gum and your breast.  Make sure that your baby's mouth is correctly positioned around your nipple (latched). Your baby's lips should create a seal on your breast and be turned out (everted).  It is common for your baby to suck about 2-3 minutes in order to start the flow of breast milk. Latching Teaching your baby how to latch onto your breast properly is very important. An improper latch can cause nipple pain, decreased milk supply, and poor weight gain in your baby. Also, if your baby is not latched onto your nipple properly, he or she may swallow some air during feeding. This can make your baby fussy. Burping your baby when you switch breasts during the feeding can help to get rid of the air. However, teaching your baby to latch on properly is still the best way to prevent fussiness from swallowing air while breastfeeding. Signs that your baby has successfully  latched onto your nipple  Silent tugging or silent sucking, without causing you pain. Infant's lips should be extended outward (flanged).  Swallowing heard between every 3-4 sucks once your milk has started to flow (after your let-down milk reflex occurs).  Muscle movement above and in front of his or her ears while sucking. Signs that your baby has not successfully latched onto your nipple  Sucking sounds or smacking sounds from your baby while breastfeeding.  Nipple pain. If you think your baby has not latched on correctly, slip your finger into the corner of your baby's mouth to break the suction and place it between your baby's gums. Attempt to start breastfeeding again. Signs of successful breastfeeding Signs from your baby  Your baby will gradually decrease the number of sucks or will completely stop sucking.  Your baby will fall asleep.  Your baby's body will relax.  Your baby will retain a small amount of milk in his or her mouth.  Your baby will let go of your breast by himself or herself. Signs from you  Breasts that have increased in firmness, weight, and size 1-3 hours after feeding.  Breasts that are softer immediately after breastfeeding.  Increased milk volume, as well as a change in milk consistency and color by the fifth day of breastfeeding.  Nipples that are not sore, cracked, or bleeding. Signs that your baby is getting enough milk  Wetting at least 1-2 diapers during the first 24 hours after birth.  Wetting at least 5-6 diapers every 24 hours for the first week after birth. The urine should be clear or pale yellow by the age of 5 days.  Wetting 6-8 diapers every 24 hours as   your baby continues to grow and develop.  At least 3 stools in a 24-hour period by the age of 5 days. The stool should be soft and yellow.  At least 3 stools in a 24-hour period by the age of 7 days. The stool should be seedy and yellow.  No loss of weight greater than 10% of  birth weight during the first 3 days of life.  Average weight gain of 4-7 oz (113-198 g) per week after the age of 4 days.  Consistent daily weight gain by the age of 5 days, without weight loss after the age of 2 weeks. After a feeding, your baby may spit up a small amount of milk. This is normal. Breastfeeding frequency and duration Frequent feeding will help you make more milk and can prevent sore nipples and extremely full breasts (breast engorgement). Breastfeed when you feel the need to reduce the fullness of your breasts or when your baby shows signs of hunger. This is called "breastfeeding on demand." Signs that your baby is hungry include:  Increased alertness, activity, or restlessness.  Movement of the head from side to side.  Opening of the mouth when the corner of the mouth or cheek is stroked (rooting).  Increased sucking sounds, smacking lips, cooing, sighing, or squeaking.  Hand-to-mouth movements and sucking on fingers or hands.  Fussing or crying. Avoid introducing a pacifier to your baby in the first 4-6 weeks after your baby is born. After this time, you may choose to use a pacifier. Research has shown that pacifier use during the first year of a baby's life decreases the risk of sudden infant death syndrome (SIDS). Allow your baby to feed on each breast as long as he or she wants. When your baby unlatches or falls asleep while feeding from the first breast, offer the second breast. Because newborns are often sleepy in the first few weeks of life, you may need to awaken your baby to get him or her to feed. Breastfeeding times will vary from baby to baby. However, the following rules can serve as a guide to help you make sure that your baby is properly fed:  Newborns (babies 4 weeks of age or younger) may breastfeed every 1-3 hours.  Newborns should not go without breastfeeding for longer than 3 hours during the day or 5 hours during the night.  You should breastfeed  your baby a minimum of 8 times in a 24-hour period. Breast milk pumping     Pumping and storing breast milk allows you to make sure that your baby is exclusively fed your breast milk, even at times when you are unable to breastfeed. This is especially important if you go back to work while you are still breastfeeding, or if you are not able to be present during feedings. Your lactation consultant can help you find a method of pumping that works best for you and give you guidelines about how long it is safe to store breast milk. Caring for your breasts while you breastfeed Nipples can become dry, cracked, and sore while breastfeeding. The following recommendations can help keep your breasts moisturized and healthy:  Avoid using soap on your nipples.  Wear a supportive bra designed especially for nursing. Avoid wearing underwire-style bras or extremely tight bras (sports bras).  Air-dry your nipples for 3-4 minutes after each feeding.  Use only cotton bra pads to absorb leaked breast milk. Leaking of breast milk between feedings is normal.  Use lanolin on your nipples   after breastfeeding. Lanolin helps to maintain your skin's normal moisture barrier. Pure lanolin is not harmful (not toxic) to your baby. You may also hand express a few drops of breast milk and gently massage that milk into your nipples and allow the milk to air-dry. In the first few weeks after giving birth, some women experience breast engorgement. Engorgement can make your breasts feel heavy, warm, and tender to the touch. Engorgement peaks within 3-5 days after you give birth. The following recommendations can help to ease engorgement:  Completely empty your breasts while breastfeeding or pumping. You may want to start by applying warm, moist heat (in the shower or with warm, water-soaked hand towels) just before feeding or pumping. This increases circulation and helps the milk flow. If your baby does not completely empty your  breasts while breastfeeding, pump any extra milk after he or she is finished.  Apply ice packs to your breasts immediately after breastfeeding or pumping, unless this is too uncomfortable for you. To do this: ? Put ice in a plastic bag. ? Place a towel between your skin and the bag. ? Leave the ice on for 20 minutes, 2-3 times a day.  Make sure that your baby is latched on and positioned properly while breastfeeding. If engorgement persists after 48 hours of following these recommendations, contact your health care provider or a lactation consultant. Overall health care recommendations while breastfeeding  Eat 3 healthy meals and 3 snacks every day. Well-nourished mothers who are breastfeeding need an additional 450-500 calories a day. You can meet this requirement by increasing the amount of a balanced diet that you eat.  Drink enough water to keep your urine pale yellow or clear.  Rest often, relax, and continue to take your prenatal vitamins to prevent fatigue, stress, and low vitamin and mineral levels in your body (nutrient deficiencies).  Do not use any products that contain nicotine or tobacco, such as cigarettes and e-cigarettes. Your baby may be harmed by chemicals from cigarettes that pass into breast milk and exposure to secondhand smoke. If you need help quitting, ask your health care provider.  Avoid alcohol.  Do not use illegal drugs or marijuana.  Talk with your health care provider before taking any medicines. These include over-the-counter and prescription medicines as well as vitamins and herbal supplements. Some medicines that may be harmful to your baby can pass through breast milk.  It is possible to become pregnant while breastfeeding. If birth control is desired, ask your health care provider about options that will be safe while breastfeeding your baby. Where to find more information: [employer]: www.llli.org Contact a health care provider  if:  You feel like you want to stop breastfeeding or have become frustrated with breastfeeding.  Your nipples are cracked or bleeding.  Your breasts are red, tender, or warm.  You have: ? Painful breasts or nipples. ? A swollen area on either breast. ? A fever or chills. ? Nausea or vomiting. ? Drainage other than breast milk from your nipples.  Your breasts do not become full before feedings by the fifth day after you give birth.  You feel sad and depressed.  Your baby is: ? Too sleepy to eat well. ? Having trouble sleeping. ? More than 1 week old and wetting fewer than 6 diapers in a 24-hour period. ? Not gaining weight by 5 days of age.  Your baby has fewer than 3 stools in a 24-hour period.  Your baby's skin or   the white parts of his or her eyes become yellow. Get help right away if:  Your baby is overly tired (lethargic) and does not want to wake up and feed.  Your baby develops an unexplained fever. Summary  Breastfeeding offers many health benefits for infant and mothers.  Try to breastfeed your infant when he or she shows early signs of hunger.  Gently tickle or stroke your baby's lips with your finger or nipple to allow the baby to open his or her mouth. Bring the baby to your breast. Make sure that much of the areola is in your baby's mouth. Offer one side and burp the baby before you offer the other side.  Talk with your health care provider or lactation consultant if you have questions or you face problems as you breastfeed. This information is not intended to replace advice given to you by your health care provider. Make sure you discuss any questions you have with your health care provider. Document Revised: 12/29/2017 Document Reviewed: 11/05/2016 Elsevier Patient Education  2020 Elsevier Inc.  

## 2020-07-10 NOTE — Progress Notes (Signed)
   Subjective:    Veronica Quinn is a G2P0 [redacted]w[redacted]d being seen today for her first obstetrical visit.  Her obstetrical history is significant for obesity and first pregnancy. Patient does intend to breast feed. Pregnancy history fully reviewed.  Patient reports backache.  Vitals:   07/10/20 1014  Weight: 167 lb 14.4 oz (76.2 kg)    HISTORY: OB History  Gravida Para Term Preterm AB Living  2            SAB TAB Ectopic Multiple Live Births               # Outcome Date GA Lbr Len/2nd Weight Sex Delivery Anes PTL Lv  2 Current           1 Gravida            History reviewed. No pertinent past medical history. History reviewed. No pertinent surgical history. Family History  Problem Relation Age of Onset  . Diabetes Brother   . Diabetes Maternal Grandmother   . Hypertension Maternal Grandfather      Exam    Uterus:     Pelvic Exam:    Perineum: Normal Perineum   Vulva: normal   Vagina:  normal mucosa, normal discharge   pH:    Cervix: nulliparous appearance and cervix is closed and long   Adnexa: not evaluated   Bony Pelvis: gynecoid  System: Breast:  normal appearance, no masses or tenderness   Skin: normal coloration and turgor, no rashes    Neurologic: oriented, no focal deficits   Extremities: normal strength, tone, and muscle mass   HEENT extra ocular movement intact   Mouth/Teeth mucous membranes moist, pharynx normal without lesions and dental hygiene good   Neck supple and no masses   Cardiovascular: regular rate and rhythm   Respiratory:  appears well, vitals normal, no respiratory distress, acyanotic, normal RR, chest clear, no wheezing, crepitations, rhonchi, normal symmetric air entry   Abdomen: soft, non-tender; bowel sounds normal; no masses,  no organomegaly   Urinary:       Assessment:    Pregnancy: G2P0 Patient Active Problem List   Diagnosis Date Noted  . Supervision of normal pregnancy, antepartum 06/30/2020        Plan:     Initial  labs drawn. Prenatal vitamins. Problem list reviewed and updated. Genetic Screening discussed : panorama and AFP today.  Ultrasound discussed; fetal survey: ordered. 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 50% of 30 min visit spent on counseling and coordination of care.     Lear Carstens 07/10/2020

## 2020-07-11 LAB — CERVICOVAGINAL ANCILLARY ONLY
Chlamydia: POSITIVE — AB
Comment: NEGATIVE
Comment: NORMAL
Neisseria Gonorrhea: NEGATIVE

## 2020-07-12 LAB — CBC/D/PLT+RPR+RH+ABO+RUB AB...
Antibody Screen: NEGATIVE
Basophils Absolute: 0.1 10*3/uL (ref 0.0–0.2)
Basos: 1 %
EOS (ABSOLUTE): 0.1 10*3/uL (ref 0.0–0.4)
Eos: 1 %
HCV Ab: <0.1 s/co ratio (ref 0.0–0.9)
HIV Screen 4th Generation wRfx: NONREACTIVE
Hematocrit: 37.8 % (ref 34.0–46.6)
Hemoglobin: 12.5 g/dL (ref 11.1–15.9)
Hepatitis B Surface Ag: NEGATIVE
Immature Grans (Abs): 0 10*3/uL (ref 0.0–0.1)
Immature Granulocytes: 0 %
Lymphocytes Absolute: 1.7 10*3/uL (ref 0.7–3.1)
Lymphs: 21 %
MCH: 30.3 pg (ref 26.6–33.0)
MCHC: 33.1 g/dL (ref 31.5–35.7)
MCV: 92 fL (ref 79–97)
Monocytes Absolute: 0.6 10*3/uL (ref 0.1–0.9)
Monocytes: 7 %
Neutrophils Absolute: 5.6 10*3/uL (ref 1.4–7.0)
Neutrophils: 70 %
Platelets: 226 10*3/uL (ref 150–450)
RBC: 4.13 x10E6/uL (ref 3.77–5.28)
RDW: 13.1 % (ref 11.7–15.4)
RPR Ser Ql: NONREACTIVE
Rh Factor: POSITIVE
Rubella Antibodies, IGG: 2.91 index (ref >0.99)
WBC: 8 10*3/uL (ref 3.4–10.8)

## 2020-07-12 LAB — AFP, SERUM, OPEN SPINA BIFIDA
AFP MoM: 0.66
AFP Value: 31.5 ng/mL
Gest. Age on Collection Date: 18.3 weeks
Maternal Age At EDD: 20.9 yr
OSBR Risk 1 IN: 10000
Test Results:: NEGATIVE
Weight: 167 [lb_av]

## 2020-07-12 LAB — HCV INTERPRETATION

## 2020-07-13 LAB — CULTURE, OB URINE

## 2020-07-13 LAB — URINE CULTURE, OB REFLEX

## 2020-07-14 DIAGNOSIS — A749 Chlamydial infection, unspecified: Secondary | ICD-10-CM | POA: Insufficient documentation

## 2020-07-14 DIAGNOSIS — O98819 Other maternal infectious and parasitic diseases complicating pregnancy, unspecified trimester: Secondary | ICD-10-CM | POA: Insufficient documentation

## 2020-07-14 MED ORDER — AZITHROMYCIN 500 MG PO TABS: 1000.0000 mg | ORAL_TABLET | Freq: Once | ORAL | 1 refills | Status: AC

## 2020-07-14 MED ORDER — CEPHALEXIN 500 MG PO CAPS: 500.0000 mg | ORAL_CAPSULE | Freq: Four times a day (QID) | ORAL | 2 refills | Status: DC

## 2020-07-14 NOTE — Progress Notes (Signed)
Please inform patient of both chlamydia and UTI infection. Rx have been e-prescribed for both. She needs to inform her partner of chlamydia infection in order for him to also be treated. They both need to abstain for 7 days following treatment

## 2020-07-14 NOTE — Addendum Note (Signed)
Addended by: Catalina Antigua on: 07/14/2020 10:00 AM   Modules accepted: Orders

## 2020-07-17 ENCOUNTER — Encounter: Payer: Self-pay | Admitting: Obstetrics and Gynecology

## 2020-07-17 ENCOUNTER — Encounter: Payer: Medicaid Other | Admitting: Nurse Practitioner

## 2020-07-24 ENCOUNTER — Encounter: Payer: Self-pay | Admitting: Obstetrics and Gynecology

## 2020-07-30 ENCOUNTER — Ambulatory Visit: Payer: Medicaid Other | Attending: Obstetrics and Gynecology

## 2020-08-07 ENCOUNTER — Encounter: Payer: Self-pay | Admitting: Obstetrics

## 2020-08-07 ENCOUNTER — Other Ambulatory Visit: Payer: Self-pay

## 2020-08-07 ENCOUNTER — Encounter: Payer: Self-pay | Admitting: Certified Nurse Midwife

## 2020-08-07 ENCOUNTER — Encounter: Payer: Self-pay | Admitting: Obstetrics and Gynecology

## 2020-08-07 ENCOUNTER — Ambulatory Visit (INDEPENDENT_AMBULATORY_CARE_PROVIDER_SITE_OTHER): Payer: Medicaid Other | Admitting: Certified Nurse Midwife

## 2020-08-07 VITALS — BP 115/75 | HR 75 | Ht 66.0 in | Wt 174.4 lb

## 2020-08-07 DIAGNOSIS — A749 Chlamydial infection, unspecified: Secondary | ICD-10-CM | POA: Diagnosis not present

## 2020-08-07 DIAGNOSIS — O98812 Other maternal infectious and parasitic diseases complicating pregnancy, second trimester: Secondary | ICD-10-CM

## 2020-08-07 DIAGNOSIS — Z3480 Encounter for supervision of other normal pregnancy, unspecified trimester: Secondary | ICD-10-CM

## 2020-08-07 DIAGNOSIS — Z3A22 22 weeks gestation of pregnancy: Secondary | ICD-10-CM

## 2020-08-07 DIAGNOSIS — O2342 Unspecified infection of urinary tract in pregnancy, second trimester: Secondary | ICD-10-CM | POA: Insufficient documentation

## 2020-08-07 MED ORDER — AZITHROMYCIN 500 MG PO TABS
1000.0000 mg | ORAL_TABLET | Freq: Once | ORAL | Status: AC
  Administered 2020-08-07: 1000 mg via ORAL

## 2020-08-07 NOTE — Progress Notes (Signed)
   PRENATAL VISIT NOTE  Subjective:  Veronica Quinn is a 19 y.o. G2P0 at [redacted]w[redacted]d being seen today for ongoing prenatal care.  She is currently monitored for the following issues for this low-risk pregnancy and has Supervision of normal pregnancy, antepartum; Chlamydia infection affecting pregnancy; and Urinary tract infection during pregnancy in second trimester, antepartum on their problem list.  Patient reports gums bleeding with brushing.  Contractions: Not present. Vag. Bleeding: None.  Movement: Present. Denies leaking of fluid.   The following portions of the patient's history were reviewed and updated as appropriate: allergies, current medications, past family history, past medical history, past social history, past surgical history and problem list.   Objective:   Vitals:   08/07/20 1050 08/07/20 1054  BP: 115/75   Pulse: 75   Weight: 174 lb 6.4 oz (79.1 kg)   Height:  5\' 6"  (1.676 m)    Fetal Status: Fetal Heart Rate (bpm): 150 Fundal Height: 20 cm Movement: Present     General:  Alert, oriented and cooperative. Patient is in no acute distress.  Skin: Skin is warm and dry. No rash noted.   Cardiovascular: Normal heart rate noted  Respiratory: Normal respiratory effort, no problems with respiration noted  Abdomen: Soft, gravid, appropriate for gestational age.  Pain/Pressure: Absent     Pelvic: Cervical exam deferred        Extremities: Normal range of motion.  Edema: None  Mental Status: Normal mood and affect. Normal behavior. Normal judgment and thought content.   Assessment and Plan:  Pregnancy: G2P0 at 106w3d 1. Supervision of other normal pregnancy, antepartum - Patient doing well, reports gums bleeding with brushing  - Patient reports brushing teeth twice a day but reports that she does not floss, discussed with patient increased risk for gingivitis during pregnancy and encouraged patient to floss, discussed with patient that she can continue to go to dentist during  pregnancy and dental letter given to patient  - Routine prenatal care - Anticipatory guidance on upcoming appointments with next appointment GTT. Discussed with patient to present to next appointment fasting after MN for GTT, patient verbalizes understanding  - Educated and discussed abnormal results for GTT and what it means   2. Chlamydia infection affecting pregnancy in second trimester - Patient has not picked up prescription, medication given in office today  - Patient reports that she currently does not have partner and is not having intercourse  - azithromycin (ZITHROMAX) tablet 1,000 mg  3. [redacted] weeks gestation of pregnancy  4. Urinary tract infection during pregnancy in second trimester, antepartum - Patient has not picked up medication for UTI - Encouraged patient to pick up Rx and take in completion - TOC at next prenatal visit   Preterm labor symptoms and general obstetric precautions including but not limited to vaginal bleeding, contractions, leaking of fluid and fetal movement were reviewed in detail with the patient. Please refer to After Visit Summary for other counseling recommendations.   Return in about 4 weeks (around 09/04/2020) for ROB/TOC/GTT.  Future Appointments  Date Time Provider Department Center  08/21/2020 11:00 AM [facility]  09/04/2020  8:00 AM [facility] None  09/04/2020  8:35 AM 09/06/2020, CNM [facility] None    Gerrit Heck, CNM

## 2020-08-07 NOTE — Patient Instructions (Signed)
Glucose Tolerance Test During Pregnancy Why am I having this test? The glucose tolerance test (GTT) is done to check how your body processes sugar (glucose). This is one of several tests used to diagnose diabetes that develops during pregnancy (gestational diabetes mellitus). Gestational diabetes is a temporary form of diabetes that some women develop during pregnancy. It usually occurs during the second trimester of pregnancy and goes away after delivery. Testing (screening) for gestational diabetes usually occurs between 24 and 28 weeks of pregnancy. You may have the GTT test after having a 1-hour glucose screening test if the results from that test indicate that you may have gestational diabetes. You may also have this test if:  You have a history of gestational diabetes.  You have a history of giving birth to very large babies or have experienced repeated fetal loss (stillbirth).  You have signs and symptoms of diabetes, such as: ? Changes in your vision. ? Tingling or numbness in your hands or feet. ? Changes in hunger, thirst, and urination that are not otherwise explained by your pregnancy. What is being tested? This test measures the amount of glucose in your blood at different times during a period of 3 hours. This indicates how well your body is able to process glucose. What kind of sample is taken?  Blood samples are required for this test. They are usually collected by inserting a needle into a blood vessel. How do I prepare for this test?  For 3 days before your test, eat normally. Have plenty of carbohydrate-rich foods.  Follow instructions from your health care provider about: ? Eating or drinking restrictions on the day of the test. You may be asked to not eat or drink anything other than water (fast) starting 8-10 hours before the test. ? Changing or stopping your regular medicines. Some medicines may interfere with this test. Tell a health care provider about:  All  medicines you are taking, including vitamins, herbs, eye drops, creams, and over-the-counter medicines.  Any blood disorders you have.  Any surgeries you have had.  Any medical conditions you have. What happens during the test? First, your blood glucose will be measured. This is referred to as your fasting blood glucose, since you fasted before the test. Then, you will drink a glucose solution that contains a certain amount of glucose. Your blood glucose will be measured again 1, 2, and 3 hours after drinking the solution. This test takes about 3 hours to complete. You will need to stay at the testing location during this time. During the testing period:  Do not eat or drink anything other than the glucose solution.  Do not exercise.  Do not use any products that contain nicotine or tobacco, such as cigarettes and e-cigarettes. If you need help stopping, ask your health care provider. The testing procedure may vary among health care providers and hospitals. How are the results reported? Your results will be reported as milligrams of glucose per deciliter of blood (mg/dL) or millimoles per liter (mmol/L). Your health care provider will compare your results to normal ranges that were established after testing a large group of people (reference ranges). Reference ranges may vary among labs and hospitals. For this test, common reference ranges are:  Fasting: less than 95-105 mg/dL (5.3-5.8 mmol/L).  1 hour after drinking glucose: less than 180-190 mg/dL (10.0-10.5 mmol/L).  2 hours after drinking glucose: less than 155-165 mg/dL (8.6-9.2 mmol/L).  3 hours after drinking glucose: 140-145 mg/dL (7.8-8.1 mmol/L). What do the   results mean? Results within reference ranges are considered normal, meaning that your glucose levels are well-controlled. If two or more of your blood glucose levels are high, you may be diagnosed with gestational diabetes. If only one level is high, your health care  provider may suggest repeat testing or other tests to confirm a diagnosis. Talk with your health care provider about what your results mean. Questions to ask your health care provider Ask your health care provider, or the department that is doing the test:  When will my results be ready?  How will I get my results?  What are my treatment options?  What other tests do I need?  What are my next steps? Summary  The glucose tolerance test (GTT) is one of several tests used to diagnose diabetes that develops during pregnancy (gestational diabetes mellitus). Gestational diabetes is a temporary form of diabetes that some women develop during pregnancy.  You may have the GTT test after having a 1-hour glucose screening test if the results from that test indicate that you may have gestational diabetes. You may also have this test if you have any symptoms or risk factors for gestational diabetes.  Talk with your health care provider about what your results mean. This information is not intended to replace advice given to you by your health care provider. Make sure you discuss any questions you have with your health care provider. Document Revised: 01/25/2019 Document Reviewed: 05/16/2017 Elsevier Patient Education  2020 Elsevier Inc.  

## 2020-08-07 NOTE — Progress Notes (Signed)
Pt is here for ROB, [redacted]w[redacted]d.

## 2020-08-21 ENCOUNTER — Other Ambulatory Visit: Payer: Self-pay | Admitting: Obstetrics and Gynecology

## 2020-08-21 ENCOUNTER — Ambulatory Visit: Payer: Medicaid Other | Attending: Obstetrics and Gynecology

## 2020-08-21 ENCOUNTER — Other Ambulatory Visit: Payer: Self-pay | Admitting: *Deleted

## 2020-08-21 ENCOUNTER — Other Ambulatory Visit: Payer: Self-pay

## 2020-08-21 DIAGNOSIS — Z362 Encounter for other antenatal screening follow-up: Secondary | ICD-10-CM

## 2020-08-21 DIAGNOSIS — Z3490 Encounter for supervision of normal pregnancy, unspecified, unspecified trimester: Secondary | ICD-10-CM | POA: Insufficient documentation

## 2020-09-04 ENCOUNTER — Other Ambulatory Visit: Payer: Medicaid Other

## 2020-09-19 ENCOUNTER — Ambulatory Visit: Payer: Medicaid Other | Attending: Obstetrics and Gynecology

## 2020-09-19 ENCOUNTER — Ambulatory Visit: Payer: Medicaid Other | Admitting: *Deleted

## 2020-09-19 ENCOUNTER — Other Ambulatory Visit: Payer: Self-pay

## 2020-09-19 ENCOUNTER — Encounter: Payer: Self-pay | Admitting: *Deleted

## 2020-09-19 VITALS — BP 115/63 | HR 88

## 2020-09-19 DIAGNOSIS — Z3492 Encounter for supervision of normal pregnancy, unspecified, second trimester: Secondary | ICD-10-CM | POA: Diagnosis present

## 2020-09-19 DIAGNOSIS — Z362 Encounter for other antenatal screening follow-up: Secondary | ICD-10-CM | POA: Insufficient documentation

## 2020-09-19 DIAGNOSIS — Z148 Genetic carrier of other disease: Secondary | ICD-10-CM

## 2020-09-19 DIAGNOSIS — Z3687 Encounter for antenatal screening for uncertain dates: Secondary | ICD-10-CM | POA: Diagnosis not present

## 2020-09-19 DIAGNOSIS — O358XX0 Maternal care for other (suspected) fetal abnormality and damage, not applicable or unspecified: Secondary | ICD-10-CM

## 2020-09-19 DIAGNOSIS — Z3A24 24 weeks gestation of pregnancy: Secondary | ICD-10-CM

## 2020-09-24 ENCOUNTER — Encounter: Payer: Medicaid Other | Admitting: Obstetrics and Gynecology

## 2020-09-24 ENCOUNTER — Other Ambulatory Visit: Payer: Medicaid Other

## 2020-09-26 ENCOUNTER — Other Ambulatory Visit (HOSPITAL_COMMUNITY)
Admission: RE | Admit: 2020-09-26 | Discharge: 2020-09-26 | Disposition: A | Discharge status: Home / Self Care | Payer: Medicaid Other | Source: Ambulatory Visit | Attending: Obstetrics | Admitting: Obstetrics

## 2020-09-26 ENCOUNTER — Encounter: Payer: Self-pay | Admitting: Obstetrics

## 2020-09-26 ENCOUNTER — Other Ambulatory Visit: Payer: Medicaid Other

## 2020-09-26 ENCOUNTER — Ambulatory Visit (INDEPENDENT_AMBULATORY_CARE_PROVIDER_SITE_OTHER): Payer: Medicaid Other | Admitting: Obstetrics

## 2020-09-26 ENCOUNTER — Other Ambulatory Visit: Payer: Self-pay

## 2020-09-26 VITALS — BP 111/67 | HR 75 | Wt 191.0 lb

## 2020-09-26 DIAGNOSIS — A749 Chlamydial infection, unspecified: Secondary | ICD-10-CM

## 2020-09-26 DIAGNOSIS — Z3480 Encounter for supervision of other normal pregnancy, unspecified trimester: Secondary | ICD-10-CM

## 2020-09-26 DIAGNOSIS — O98812 Other maternal infectious and parasitic diseases complicating pregnancy, second trimester: Secondary | ICD-10-CM

## 2020-09-26 DIAGNOSIS — M549 Dorsalgia, unspecified: Secondary | ICD-10-CM

## 2020-09-26 MED ORDER — COMFORT FIT MATERNITY SUPP MED MISC: 0 refills | Status: DC

## 2020-09-26 NOTE — Progress Notes (Signed)
Pt is having back and upper abd pain.  Pt will do self swab today for TOC, hx +CH.

## 2020-09-26 NOTE — Progress Notes (Signed)
Subjective:  Veronica Quinn is a 19 y.o. G1P0 at [redacted]w[redacted]d being seen today for ongoing prenatal care.  She is currently monitored for the following issues for this low-risk pregnancy and has Supervision of normal pregnancy, antepartum; Chlamydia infection affecting pregnancy; and Urinary tract infection during pregnancy in second trimester, antepartum on their problem list.  Patient reports backache.  Contractions: Not present. Vag. Bleeding: None.  Movement: Present. Denies leaking of fluid.   The following portions of the patient's history were reviewed and updated as appropriate: allergies, current medications, past family history, past medical history, past social history, past surgical history and problem list. Problem list updated.  Objective:   Vitals:   09/26/20 0841  BP: 111/67  Pulse: 75  Weight: 191 lb (86.6 kg)    Fetal Status:     Movement: Present     General:  Alert, oriented and cooperative. Patient is in no acute distress.  Skin: Skin is warm and dry. No rash noted.   Cardiovascular: Normal heart rate noted  Respiratory: Normal respiratory effort, no problems with respiration noted  Abdomen: Soft, gravid, appropriate for gestational age. Pain/Pressure: Present     Pelvic:  Cervical exam deferred        Extremities: Normal range of motion.     Mental Status: Normal mood and affect. Normal behavior. Normal judgment and thought content.   Urinalysis:      Assessment and Plan:  Pregnancy: G1P0 at [redacted]w[redacted]d  1. Supervision of other normal pregnancy, antepartum Rx: - Glucose Tolerance, 2 Hours w/1 Hour - CBC - HIV Antibody (routine testing w rflx) - RPR  2. Chlamydia infection affecting pregnancy in second trimester Rx: - Cervicovaginal ancillary only( [place])  3. Backache symptom Rx: - Elastic Bandages & Supports (COMFORT FIT MATERNITY SUPP MED) MISC; Wear daily when ambulating  Dispense: 1 each; Refill: 0  Preterm labor symptoms and general obstetric  precautions including but not limited to vaginal bleeding, contractions, leaking of fluid and fetal movement were reviewed in detail with the patient. Please refer to After Visit Summary for other counseling recommendations.   Return in about 3 weeks (around 10/17/2020) for MyChart.   Brock Bad, MD  09/26/20

## 2020-09-27 LAB — CBC
Hematocrit: 35.1 % (ref 34.0–46.6)
Hemoglobin: 11.3 g/dL (ref 11.1–15.9)
MCH: 28.9 pg (ref 26.6–33.0)
MCHC: 32.2 g/dL (ref 31.5–35.7)
MCV: 90 fL (ref 79–97)
Platelets: 222 10*3/uL (ref 150–450)
RBC: 3.91 x10E6/uL (ref 3.77–5.28)
RDW: 12.8 % (ref 11.7–15.4)
WBC: 8.7 10*3/uL (ref 3.4–10.8)

## 2020-09-27 LAB — GLUCOSE TOLERANCE, 2 HOURS W/ 1HR
Glucose, 1 hour: 89 mg/dL (ref 65–179)
Glucose, 2 hour: 64 mg/dL — ABNORMAL LOW (ref 65–152)
Glucose, Fasting: 86 mg/dL (ref 65–91)

## 2020-09-27 LAB — RPR: RPR Ser Ql: NONREACTIVE

## 2020-09-27 LAB — HIV ANTIBODY (ROUTINE TESTING W REFLEX): HIV Screen 4th Generation wRfx: NONREACTIVE

## 2020-09-29 LAB — CERVICOVAGINAL ANCILLARY ONLY
Bacterial Vaginitis (gardnerella): NEGATIVE
Chlamydia: NEGATIVE
Comment: NEGATIVE
Comment: NEGATIVE
Comment: NEGATIVE
Comment: NORMAL
Neisseria Gonorrhea: NEGATIVE
Trichomonas: NEGATIVE

## 2020-10-06 ENCOUNTER — Other Ambulatory Visit: Payer: Self-pay | Admitting: *Deleted

## 2020-10-06 MED ORDER — OMEPRAZOLE 20 MG PO CPDR: 20.0000 mg | DELAYED_RELEASE_CAPSULE | Freq: Two times a day (BID) | ORAL | 2 refills | Status: DC

## 2020-10-06 NOTE — Progress Notes (Signed)
Pt sent message needing Rx for heartburn. Per Dr Clearance Coots, ok to send Omeprazole since it is covered.

## 2020-10-16 ENCOUNTER — Telehealth (INDEPENDENT_AMBULATORY_CARE_PROVIDER_SITE_OTHER): Payer: Medicaid Other | Admitting: Nurse Practitioner

## 2020-10-16 DIAGNOSIS — Z3A28 28 weeks gestation of pregnancy: Secondary | ICD-10-CM

## 2020-10-16 DIAGNOSIS — Z3400 Encounter for supervision of normal first pregnancy, unspecified trimester: Secondary | ICD-10-CM

## 2020-10-16 DIAGNOSIS — A749 Chlamydial infection, unspecified: Secondary | ICD-10-CM

## 2020-10-16 DIAGNOSIS — Z3403 Encounter for supervision of normal first pregnancy, third trimester: Secondary | ICD-10-CM

## 2020-10-16 NOTE — Progress Notes (Signed)
Patient presents for ROB via mychart. Patient states that she is not able to do a mychart visit do to having connection issues. Patient states that she can do visit by phone. Not bale to check bp at this time. No other concerns

## 2020-10-16 NOTE — Progress Notes (Signed)
   TELEHEALTH OBSTETRICS VISIT ENCOUNTER NOTE  I connected with Veronica Quinn on 10/16/20 at  2:40 PM EST by telephone at home and verified that I am speaking with the correct person using two identifiers.  Poor connection and even phone connection was not continuous - lots of blinking in and out and difficult to follow client's full answer to questions.   I discussed the limitations, risks, security and privacy concerns of performing an evaluation and management service by telephone and the availability of in person appointments. I also discussed with the patient that there may be a patient responsible charge related to this service. The patient expressed understanding and agreed to proceed.  Subjective:  Veronica Quinn is a 19 y.o. G1P0 at [redacted]w[redacted]d being followed for ongoing prenatal care.  She is currently monitored for the following issues for this low-risk pregnancy and has Supervision of normal pregnancy, antepartum; Chlamydia infection affecting pregnancy; and Urinary tract infection during pregnancy in second trimester, antepartum on their problem list.  Patient reports no complaints. Reports fetal movement. Denies any contractions, bleeding or leaking of fluid.   The following portions of the patient's history were reviewed and updated as appropriate: allergies, current medications, past family history, past medical history, past social history, past surgical history and problem list.   Objective:   General:  Alert, oriented and cooperative.   Mental Status: Normal mood and affect perceived. Normal judgment and thought content.  Rest of physical exam deferred due to type of encounter  Assessment and Plan:  Pregnancy: G1P0 at [redacted]w[redacted]d 1. Supervision of pregnancy Does not yet have BP cuff - will try to pick up - was not checked today Has navigator assisting with the pregnancy - Hillary Has gotten signed up for breastfeeding classes but not yet for childbirth classes Has not selected a  pediatrician Is undecided about the Covid vaccine   Preterm labor symptoms and general obstetric precautions including but not limited to vaginal bleeding, contractions, leaking of fluid and fetal movement were reviewed in detail with the patient.  I discussed the assessment and treatment plan with the patient. The patient was provided an opportunity to ask questions and all were answered. The patient agreed with the plan and demonstrated an understanding of the instructions. The patient was advised to call back or seek an in-person office evaluation/go to [facility] at [facility] for any urgent or concerning symptoms. Please refer to After Visit Summary for other counseling recommendations.   I provided 5 minutes of non-face-to-face time during this encounter.  Return in about 2 weeks (around 10/30/2020) for in person ROB - does not have BP cuff.  Currie Paris, NP Center for [employer], [facility]

## 2020-10-16 NOTE — Patient Instructions (Signed)
[email or web address] 

## 2020-10-18 NOTE — L&D Delivery Note (Signed)
Delivery Note Had been pushing involuntarily while laboring down.  Progressed to +3 and pushed for just a short while to delivery.   At 4:22 AM a viable and healthy female was delivered via Vaginal, Spontaneous (Presentation: Left Occiput Anterior).  APGAR: 8, 9; weight  .   Placenta status: Spontaneous, Intact.  Cord: 3 vessels with the following complications: Nuchal cord x 2 with body cord x 1  Anesthesia: Epidural;Local Episiotomy: None Lacerations: 2nd degree;Labial Suture Repair: 3.0 monocryl Est. Blood Loss (mL):  300 Delivered by Dr Catalina Gravel with me at his side supervising.   Mom to postpartum.  Baby to Couplet care / Skin to Skin.  Wynelle Bourgeois 12/31/2020, 5:17 AM

## 2020-10-30 ENCOUNTER — Other Ambulatory Visit: Payer: Self-pay

## 2020-10-30 ENCOUNTER — Ambulatory Visit (INDEPENDENT_AMBULATORY_CARE_PROVIDER_SITE_OTHER): Payer: Medicaid Other | Admitting: Certified Nurse Midwife

## 2020-10-30 VITALS — BP 108/67 | HR 92 | Wt 207.0 lb

## 2020-10-30 DIAGNOSIS — Z3480 Encounter for supervision of other normal pregnancy, unspecified trimester: Secondary | ICD-10-CM

## 2020-10-30 DIAGNOSIS — O99891 Other specified diseases and conditions complicating pregnancy: Secondary | ICD-10-CM

## 2020-10-30 DIAGNOSIS — M549 Dorsalgia, unspecified: Secondary | ICD-10-CM

## 2020-10-30 DIAGNOSIS — Z3A30 30 weeks gestation of pregnancy: Secondary | ICD-10-CM

## 2020-10-30 NOTE — Progress Notes (Signed)
   PRENATAL VISIT NOTE  Subjective:  Veronica Quinn is a 20 y.o. G1P0 at [redacted]w[redacted]d being seen today for ongoing prenatal care.  She is currently monitored for the following issues for this low-risk pregnancy and has Supervision of normal pregnancy, antepartum; Chlamydia infection affecting pregnancy; and Urinary tract infection during pregnancy in second trimester, antepartum on their problem list.  Patient reports backache, no bleeding, no contractions, no cramping and no leaking. Patient reports back pain that burns in her right lower back and radiates underneath her right coastal area. Patient desires a note for work to decrease her hours to part time due to back pain.  Contractions: Not present. Vag. Bleeding: None.  Movement: Present. Denies leaking of fluid.   The following portions of the patient's history were reviewed and updated as appropriate: allergies, current medications, past family history, past medical history, past social history, past surgical history and problem list.   Objective:   Vitals:   10/30/20 1035  BP: 108/67  Pulse: 92  Weight: 93.9 kg    Fetal Status: Fetal Heart Rate (bpm): 137 Fundal Height: 30 cm Movement: Present     General:  Alert, oriented and cooperative. Patient is in no acute distress.  Skin: Skin is warm and dry. No rash noted.   Cardiovascular: Normal heart rate noted  Respiratory: Normal respiratory effort, no problems with respiration noted  Abdomen: Soft, gravid, appropriate for gestational age.  Pain/Pressure: Present     Pelvic: Cervical exam deferred        Extremities: Normal range of motion.  Edema: Trace  Mental Status: Normal mood and affect. Normal behavior. Normal judgment and thought content.   Assessment and Plan:  Pregnancy: G1P0 at [redacted]w[redacted]d 1. Supervision of other normal pregnancy, antepartum   2. [redacted] weeks gestation of pregnancy   3. Back pain affecting pregnancy in third trimester - Patient encouraged to wear maternity  support belt  Preterm labor symptoms and general obstetric precautions including but not limited to vaginal bleeding, contractions, leaking of fluid and fetal movement were reviewed in detail with the patient. Please refer to After Visit Summary for other counseling recommendations.   Return in about 3 weeks (around 11/20/2020) for LROB, virtual.  Future Appointments  Date Time Provider Department Center  11/20/2020  9:35 AM Gerrit Heck, CNM [facility] None    Sande Rives, Student-MidWife

## 2020-10-30 NOTE — Patient Instructions (Signed)

## 2020-11-10 ENCOUNTER — Encounter: Payer: Self-pay | Admitting: *Deleted

## 2020-11-20 ENCOUNTER — Telehealth: Payer: Medicaid Other | Admitting: Family Medicine

## 2020-11-20 DIAGNOSIS — Z3480 Encounter for supervision of other normal pregnancy, unspecified trimester: Secondary | ICD-10-CM

## 2020-11-20 NOTE — Progress Notes (Signed)
No show

## 2020-11-20 NOTE — Patient Instructions (Signed)

## 2020-12-11 ENCOUNTER — Ambulatory Visit (INDEPENDENT_AMBULATORY_CARE_PROVIDER_SITE_OTHER): Payer: Medicaid Other | Admitting: Obstetrics and Gynecology

## 2020-12-11 ENCOUNTER — Other Ambulatory Visit (HOSPITAL_COMMUNITY)
Admission: RE | Admit: 2020-12-11 | Discharge: 2020-12-11 | Disposition: A | Discharge status: Home / Self Care | Payer: Medicaid Other | Source: Ambulatory Visit | Attending: Obstetrics and Gynecology | Admitting: Obstetrics and Gynecology

## 2020-12-11 ENCOUNTER — Other Ambulatory Visit: Payer: Self-pay

## 2020-12-11 VITALS — BP 104/74 | HR 81 | Wt 219.0 lb

## 2020-12-11 DIAGNOSIS — O98813 Other maternal infectious and parasitic diseases complicating pregnancy, third trimester: Secondary | ICD-10-CM

## 2020-12-11 DIAGNOSIS — Z3480 Encounter for supervision of other normal pregnancy, unspecified trimester: Secondary | ICD-10-CM | POA: Diagnosis present

## 2020-12-11 DIAGNOSIS — A749 Chlamydial infection, unspecified: Secondary | ICD-10-CM | POA: Diagnosis present

## 2020-12-11 NOTE — Progress Notes (Signed)
   PRENATAL VISIT NOTE  Subjective:  Veronica Quinn is a 20 y.o. G1P0 at [redacted]w[redacted]d being seen today for ongoing prenatal care.  She is currently monitored for the following issues for this low-risk pregnancy and has Supervision of normal pregnancy, antepartum; Chlamydia infection affecting pregnancy; and Urinary tract infection during pregnancy in second trimester, antepartum on their problem list.  Patient reports no complaints.  Contractions: Irregular. Vag. Bleeding: None.  Movement: Present. Denies leaking of fluid.   The following portions of the patient's history were reviewed and updated as appropriate: allergies, current medications, past family history, past medical history, past social history, past surgical history and problem list.   Objective:   Vitals:   12/11/20 1006  BP: 104/74  Pulse: 81  Weight: 219 lb (99.3 kg)    Fetal Status: Fetal Heart Rate (bpm): 130 Fundal Height: 35 cm Movement: Present     General:  Alert, oriented and cooperative. Patient is in no acute distress.  Skin: Skin is warm and dry. No rash noted.   Cardiovascular: Normal heart rate noted  Respiratory: Normal respiratory effort, no problems with respiration noted  Abdomen: Soft, gravid, appropriate for gestational age.  Pain/Pressure: Present     Pelvic: Cervical exam deferred        Extremities: Normal range of motion.     Mental Status: Normal mood and affect. Normal behavior. Normal judgment and thought content.   Assessment and Plan:  Pregnancy: G1P0 at [redacted]w[redacted]d  1. Chlamydia infection affecting pregnancy in third trimester  - Strep Gp B NAA - Cervicovaginal ancillary only( [place]) - Culture, OB Urine  2. Supervision of other normal pregnancy, antepartum  - Strep Gp B NAA - Cervicovaginal ancillary only( [place]) - Culture, OB Urine - Discussed cervical ripening at home. List provided  Preterm labor symptoms and general obstetric precautions including but not limited to vaginal  bleeding, contractions, leaking of fluid and fetal movement were reviewed in detail with the patient. Please refer to After Visit Summary for other counseling recommendations.   Return in about 1 week (around 12/18/2020), or in person, app ok.  Future Appointments  Date Time Provider Department Center  12/18/2020 11:15 AM Gerrit Heck, CNM [facility] None    Venia Carbon, NP

## 2020-12-11 NOTE — Patient Instructions (Signed)

## 2020-12-11 NOTE — Progress Notes (Signed)
Pt has been having diarrhea with stomach ache, usually in mornings x 1 month.  Pt is also taking medication for upcoming tooth extraction, cannot remember name of med.

## 2020-12-12 LAB — CERVICOVAGINAL ANCILLARY ONLY
Chlamydia: NEGATIVE
Comment: NEGATIVE
Comment: NEGATIVE
Comment: NORMAL
Neisseria Gonorrhea: NEGATIVE
Trichomonas: NEGATIVE

## 2020-12-13 LAB — URINE CULTURE, OB REFLEX

## 2020-12-13 LAB — STREP GP B NAA: Strep Gp B NAA: POSITIVE — AB

## 2020-12-13 LAB — CULTURE, OB URINE

## 2020-12-17 ENCOUNTER — Encounter: Payer: Self-pay | Admitting: Obstetrics and Gynecology

## 2020-12-17 DIAGNOSIS — O99820 Streptococcus B carrier state complicating pregnancy: Secondary | ICD-10-CM | POA: Insufficient documentation

## 2020-12-25 ENCOUNTER — Other Ambulatory Visit: Payer: Self-pay

## 2020-12-25 ENCOUNTER — Ambulatory Visit (INDEPENDENT_AMBULATORY_CARE_PROVIDER_SITE_OTHER): Payer: Medicaid Other | Admitting: Certified Nurse Midwife

## 2020-12-25 VITALS — BP 116/77 | HR 94 | Wt 225.2 lb

## 2020-12-25 DIAGNOSIS — Z3A38 38 weeks gestation of pregnancy: Secondary | ICD-10-CM

## 2020-12-25 DIAGNOSIS — Z3400 Encounter for supervision of normal first pregnancy, unspecified trimester: Secondary | ICD-10-CM

## 2020-12-25 NOTE — Patient Instructions (Signed)
New Induction of Labor Process for [facility]  In Fall 2020 [facility] changed it's process for scheduling inductions of labor to create more induction slots and to make sure patients get COVID-19 testing in advance. After you have been tested you need to quarantine so that you do not get infected after your test. You should not go anywhere after your test except necessary medical appointments.  You have been scheduled for induction of labor on 3/15. Although you may have a specific time listed on your After Visit Summary or MyChart, we cannot predict when your room will be available. Please disregard this time. A Labor and Delivery staff member will call you on the day that you are scheduled when your room is available. You will need to arrive within one hour of being called. If you do not arrive within this time frame, the next person on the list will be called in and you will move down the list. You may eat a light meal before coming to the hospital. If you go into labor, think your water has broken, experience bright red bleeding or don't feel your baby moving as much as usual before your induction, please call your Ob/Gyn's office or come to Entrance C, Maternity Assessment Unit for evaluation.  Thank you,  [facility] [employer]

## 2020-12-25 NOTE — Progress Notes (Signed)
Pt reports fetal movement with irregular contractions. Pt reports that for a few weeks she has been feeling pressure under her right breast that is more intense with she touches it.

## 2020-12-25 NOTE — Progress Notes (Signed)
   PRENATAL VISIT NOTE  Subjective:  Veronica Quinn is a 20 y.o. G1P0 at [redacted]w[redacted]d being seen today for ongoing prenatal care.  She is currently monitored for the following issues for this low-risk pregnancy and has Supervision of normal pregnancy, antepartum; Chlamydia infection affecting pregnancy; Urinary tract infection during pregnancy in second trimester, antepartum; and GBS (group B Streptococcus carrier), +RV culture, currently pregnant on their problem list.  Patient reports occasional contractions.  Contractions: Irregular. Vag. Bleeding: None.  Movement: Present. Denies leaking of fluid.   The following portions of the patient's history were reviewed and updated as appropriate: allergies, current medications, past family history, past medical history, past social history, past surgical history and problem list.   Objective:   Vitals:   12/25/20 1348  BP: 116/77  Pulse: 94  Weight: 225 lb 3.2 oz (102.2 kg)    Fetal Status: Fetal Heart Rate (bpm): 138 Fundal Height: 36 cm Movement: Present  Presentation: Vertex  General:  Alert, oriented and cooperative. Patient is in no acute distress.  Skin: Skin is warm and dry. No rash noted.   Cardiovascular: Normal heart rate noted  Respiratory: Normal respiratory effort, no problems with respiration noted  Abdomen: Soft, gravid, appropriate for gestational age.  Pain/Pressure: Present     Pelvic: Cervical exam performed in the presence of a chaperone Dilation: 1.5 Effacement (%): 50 Station: -3  Extremities: Normal range of motion.  Edema: Trace  Mental Status: Normal mood and affect. Normal behavior. Normal judgment and thought content.   Assessment and Plan:  Pregnancy: G1P0 at [redacted]w[redacted]d 1. Supervision of normal first pregnancy, antepartum - patient doing okay, patient reports this past week has been the worse when it comes to the pregnancy and she is tired and exhausted  - Patient request induction - patient reports that she wants to be  induced next week. Educated and discussed with patient increased risk for C/S with elective induction, patient verbalizes understanding and risk and wants to proceed with induction  - IOL scheduled for 3/15, orders for admission placed   2. [redacted] weeks gestation of pregnancy  Term labor symptoms and general obstetric precautions including but not limited to vaginal bleeding, contractions, leaking of fluid and fetal movement were reviewed in detail with the patient. Please refer to After Visit Summary for other counseling recommendations.   Future Appointments  Date Time Provider Department Center  12/30/2020  6:45 AM [facility] SCHED ROOM [facility] None    Sharyon Cable, CNM

## 2020-12-26 ENCOUNTER — Other Ambulatory Visit: Payer: Self-pay | Admitting: Advanced Practice Midwife

## 2020-12-26 ENCOUNTER — Telehealth (HOSPITAL_COMMUNITY): Payer: Self-pay | Admitting: *Deleted

## 2020-12-26 NOTE — Telephone Encounter (Signed)
Preadmission screen  

## 2020-12-29 ENCOUNTER — Other Ambulatory Visit (HOSPITAL_COMMUNITY)
Admission: RE | Admit: 2020-12-29 | Discharge: 2020-12-29 | Disposition: A | Discharge status: Home / Self Care | Payer: Medicaid Other | Source: Ambulatory Visit | Attending: Obstetrics and Gynecology | Admitting: Obstetrics and Gynecology

## 2020-12-29 DIAGNOSIS — Z20822 Contact with and (suspected) exposure to covid-19: Secondary | ICD-10-CM | POA: Insufficient documentation

## 2020-12-29 DIAGNOSIS — Z01812 Encounter for preprocedural laboratory examination: Secondary | ICD-10-CM | POA: Insufficient documentation

## 2020-12-29 LAB — SARS CORONAVIRUS 2 (TAT 6-24 HRS): SARS Coronavirus 2: NEGATIVE

## 2020-12-30 ENCOUNTER — Encounter (HOSPITAL_COMMUNITY): Payer: Self-pay | Admitting: Family Medicine

## 2020-12-30 ENCOUNTER — Inpatient Hospital Stay (HOSPITAL_COMMUNITY): Payer: Medicaid Other | Admitting: Anesthesiology

## 2020-12-30 ENCOUNTER — Inpatient Hospital Stay (HOSPITAL_COMMUNITY): Payer: Medicaid Other

## 2020-12-30 ENCOUNTER — Other Ambulatory Visit: Payer: Self-pay

## 2020-12-30 ENCOUNTER — Inpatient Hospital Stay (HOSPITAL_COMMUNITY)
Admission: AD | Admit: 2020-12-30 | Discharge: 2021-01-02 | DRG: 807 | Disposition: A | Discharge status: Home / Self Care | Payer: Medicaid Other | Attending: Family Medicine | Admitting: Family Medicine

## 2020-12-30 DIAGNOSIS — O99824 Streptococcus B carrier state complicating childbirth: Secondary | ICD-10-CM | POA: Diagnosis present

## 2020-12-30 DIAGNOSIS — O26893 Other specified pregnancy related conditions, third trimester: Secondary | ICD-10-CM | POA: Diagnosis present

## 2020-12-30 DIAGNOSIS — Z87891 Personal history of nicotine dependence: Secondary | ICD-10-CM | POA: Diagnosis not present

## 2020-12-30 DIAGNOSIS — Z20822 Contact with and (suspected) exposure to covid-19: Secondary | ICD-10-CM | POA: Diagnosis present

## 2020-12-30 DIAGNOSIS — Z3490 Encounter for supervision of normal pregnancy, unspecified, unspecified trimester: Secondary | ICD-10-CM | POA: Diagnosis present

## 2020-12-30 DIAGNOSIS — Z3A39 39 weeks gestation of pregnancy: Secondary | ICD-10-CM | POA: Diagnosis not present

## 2020-12-30 DIAGNOSIS — O99820 Streptococcus B carrier state complicating pregnancy: Secondary | ICD-10-CM

## 2020-12-30 LAB — CBC
HCT: 36.4 % (ref 36.0–46.0)
Hemoglobin: 11.6 g/dL — ABNORMAL LOW (ref 12.0–15.0)
MCH: 27.8 pg (ref 26.0–34.0)
MCHC: 31.9 g/dL (ref 30.0–36.0)
MCV: 87.1 fL (ref 80.0–100.0)
Platelets: 229 10*3/uL (ref 150–400)
RBC: 4.18 MIL/uL (ref 3.87–5.11)
RDW: 13.8 % (ref 11.5–15.5)
WBC: 9.5 10*3/uL (ref 4.0–10.5)
nRBC: 0 % (ref 0.0–0.2)

## 2020-12-30 LAB — TYPE AND SCREEN
ABO/RH(D): B POS
Antibody Screen: NEGATIVE

## 2020-12-30 LAB — RPR: RPR Ser Ql: NONREACTIVE

## 2020-12-30 MED ORDER — PHENYLEPHRINE 40 MCG/ML (10ML) SYRINGE FOR IV PUSH (FOR BLOOD PRESSURE SUPPORT): 80.0000 ug | PREFILLED_SYRINGE | INTRAVENOUS | Status: DC | PRN

## 2020-12-30 MED ORDER — DIPHENHYDRAMINE HCL 50 MG/ML IJ SOLN: 12.5000 mg | INTRAMUSCULAR | Status: DC | PRN

## 2020-12-30 MED ORDER — TERBUTALINE SULFATE 1 MG/ML IJ SOLN
0.2500 mg | Freq: Once | INTRAMUSCULAR | Status: DC | PRN
Start: 2020-12-30 — End: 2020-12-31
  Filled 2020-12-30: qty 1

## 2020-12-30 MED ORDER — LACTATED RINGERS IV SOLN
500.0000 mL | INTRAVENOUS | Status: DC | PRN
  Administered 2020-12-30: 500 mL via INTRAVENOUS
  Administered 2020-12-30: 1000 mL via INTRAVENOUS

## 2020-12-30 MED ORDER — LACTATED RINGERS IV SOLN: INTRAVENOUS | Status: DC

## 2020-12-30 MED ORDER — LACTATED RINGERS IV SOLN: 500.0000 mL | Freq: Once | INTRAVENOUS | Status: DC

## 2020-12-30 MED ORDER — FENTANYL-BUPIVACAINE-NACL 0.5-0.125-0.9 MG/250ML-% EP SOLN
12.0000 mL/h | EPIDURAL | Status: DC | PRN
Start: 2020-12-30 — End: 2020-12-31
  Administered 2020-12-30: 12 mL/h via EPIDURAL
  Filled 2020-12-30: qty 250

## 2020-12-30 MED ORDER — EPHEDRINE 5 MG/ML INJ: 10.0000 mg | INTRAVENOUS | Status: DC | PRN

## 2020-12-30 MED ORDER — OXYTOCIN-SODIUM CHLORIDE 30-0.9 UT/500ML-% IV SOLN
1.0000 m[IU]/min | INTRAVENOUS | Status: DC
  Administered 2020-12-30 – 2020-12-31 (×3): 2 m[IU]/min via INTRAVENOUS
  Filled 2020-12-30: qty 500

## 2020-12-30 MED ORDER — OXYTOCIN-SODIUM CHLORIDE 30-0.9 UT/500ML-% IV SOLN
2.5000 [IU]/h | INTRAVENOUS | Status: DC
  Administered 2020-12-31: 2.5 [IU]/h via INTRAVENOUS

## 2020-12-30 MED ORDER — PENICILLIN G POT IN DEXTROSE 60000 UNIT/ML IV SOLN
3.0000 10*6.[IU] | INTRAVENOUS | Status: DC
  Administered 2020-12-30 – 2020-12-31 (×4): 3 10*6.[IU] via INTRAVENOUS
  Filled 2020-12-30 (×4): qty 50

## 2020-12-30 MED ORDER — LACTATED RINGERS AMNIOINFUSION: INTRAVENOUS | Status: DC

## 2020-12-30 MED ORDER — ACETAMINOPHEN 325 MG PO TABS: 650.0000 mg | ORAL_TABLET | ORAL | Status: DC | PRN

## 2020-12-30 MED ORDER — MISOPROSTOL 25 MCG QUARTER TABLET
25.0000 ug | ORAL_TABLET | ORAL | Status: DC | PRN
Start: 2020-12-30 — End: 2020-12-31

## 2020-12-30 MED ORDER — LIDOCAINE HCL (PF) 1 % IJ SOLN: 30.0000 mL | INTRAMUSCULAR | Status: DC | PRN

## 2020-12-30 MED ORDER — OXYTOCIN BOLUS FROM INFUSION
333.0000 mL | Freq: Once | INTRAVENOUS | Status: AC
  Administered 2020-12-31: 333 mL via INTRAVENOUS

## 2020-12-30 MED ORDER — SOD CITRATE-CITRIC ACID 500-334 MG/5ML PO SOLN
30.0000 mL | ORAL | Status: DC | PRN
Start: 2020-12-30 — End: 2020-12-31

## 2020-12-30 MED ORDER — SODIUM CHLORIDE 0.9 % IV SOLN
5.0000 10*6.[IU] | Freq: Once | INTRAVENOUS | Status: AC
  Administered 2020-12-30: 5 10*6.[IU] via INTRAVENOUS
  Filled 2020-12-30: qty 5

## 2020-12-30 MED ORDER — MISOPROSTOL 50MCG HALF TABLET
50.0000 ug | ORAL_TABLET | ORAL | Status: DC
  Administered 2020-12-30 (×2): 50 ug via BUCCAL
  Filled 2020-12-30 (×2): qty 1

## 2020-12-30 MED ORDER — ONDANSETRON HCL 4 MG/2ML IJ SOLN: 4.0000 mg | Freq: Four times a day (QID) | INTRAMUSCULAR | Status: DC | PRN

## 2020-12-30 MED ORDER — FENTANYL CITRATE (PF) 100 MCG/2ML IJ SOLN
100.0000 ug | INTRAMUSCULAR | Status: DC | PRN
Start: 2020-12-30 — End: 2020-12-31
  Administered 2020-12-30 (×2): 100 ug via INTRAVENOUS
  Filled 2020-12-30 (×2): qty 2

## 2020-12-30 MED ORDER — TERBUTALINE SULFATE 1 MG/ML IJ SOLN
0.2500 mg | Freq: Once | INTRAMUSCULAR | Status: AC | PRN
  Administered 2020-12-30: 0.25 mg via SUBCUTANEOUS

## 2020-12-30 MED ORDER — LIDOCAINE-EPINEPHRINE (PF) 2 %-1:200000 IJ SOLN
INTRAMUSCULAR | Status: DC | PRN
  Administered 2020-12-30: 4 mL via EPIDURAL

## 2020-12-30 NOTE — Progress Notes (Signed)
Patient ID: Palyn Scrima, female   DOB: Jul 23, 2001, 19 y.o.   MRN: [id number] Called to see patient for fetal bradycardia and decels  She got and epidural at 1940 She got comfortable with epidural soon thereafter  SROM occurred at 2210, clear fluid Dilation: 7 Effacement (%): 100 Cervical Position: Posterior Station: 0 Presentation: Vertex Exam by:: Hilda Lias, CNM  Cervix is primarily anterior (abut 3+cm lip)  She proceeded to have variable decelerations to the 70s-80s (prior baseline 120s) at 2218  We turned her side to side, Internal monitors inserted  Amnioinfusion begun Terbutaline given Trendelenberg positioned (s/p epidural in lieu of knee/chest) Dr Jolayne Panther notified and arrived in room  FHR recovered once UCs spaced out  Now FHR is improved, will continue to monitor

## 2020-12-30 NOTE — Anesthesia Preprocedure Evaluation (Signed)
Anesthesia Evaluation  Patient identified by MRN, date of birth, ID band Patient awake    Reviewed: Allergy & Precautions, NPO status , Patient's Chart, lab work & pertinent test results  Airway Mallampati: II  TM Distance: >3 FB Neck ROM: Full    Dental no notable dental hx.    Pulmonary neg pulmonary ROS, former smoker,    Pulmonary exam normal breath sounds clear to auscultation       Cardiovascular negative cardio ROS Normal cardiovascular exam Rhythm:Regular Rate:Normal     Neuro/Psych  Headaches, PSYCHIATRIC DISORDERS Anxiety Depression    GI/Hepatic negative GI ROS, Neg liver ROS,   Endo/Other  negative endocrine ROS  Renal/GU negative Renal ROS  negative genitourinary   Musculoskeletal negative musculoskeletal ROS (+)   Abdominal   Peds  Hematology negative hematology ROS (+)   Anesthesia Other Findings Elective IOL  Reproductive/Obstetrics (+) Pregnancy                             Anesthesia Physical Anesthesia Plan  ASA: II  Anesthesia Plan: Epidural   Post-op Pain Management:    Induction:   PONV Risk Score and Plan: Treatment may vary due to age or medical condition  Airway Management Planned: Natural Airway  Additional Equipment:   Intra-op Plan:   Post-operative Plan:   Informed Consent: I have reviewed the patients History and Physical, chart, labs and discussed the procedure including the risks, benefits and alternatives for the proposed anesthesia with the patient or authorized representative who has indicated his/her understanding and acceptance.       Plan Discussed with: Anesthesiologist  Anesthesia Plan Comments: (Patient identified. Risks, benefits, options discussed with patient including but not limited to bleeding, infection, nerve damage, paralysis, failed block, incomplete pain control, headache, blood pressure changes, nausea, vomiting,  reactions to medication, itching, and post partum back pain. Confirmed with bedside nurse the patient's most recent platelet count. Confirmed with the patient that they are not taking any anticoagulation, have any bleeding history or any family history of bleeding disorders. Patient expressed understanding and wishes to proceed. All questions were answered. )        Anesthesia Quick Evaluation

## 2020-12-30 NOTE — Progress Notes (Addendum)
Veronica Quinn is a 20 y.o. G1P0 at [redacted]w[redacted]d by ultrasound admitted for induction of labor due to elective at term.  Subjective: Patient is doing well, no concerns.   Objective: BP 109/66   Pulse 93   Temp 98.1 F (36.7 C) (Oral)   Resp 17   Ht 5\' 6"  (1.676 m)   Wt 103 kg   LMP 03/03/2020   SpO2 98%   BMI 36.64 kg/m  No intake/output data recorded. No intake/output data recorded.  FHT:  FHR: 120 bpm, variability: moderate,  accelerations:  Present,  decelerations:  Absent UC:   None SVE:   Dilation: 1.5 Effacement (%): 50 Station: -3 Exam by:: Tarea Skillman, MD2  Labs: Lab Results  Component Value Date   WBC 9.5 12/30/2020   HGB 11.6 (L) 12/30/2020   HCT 36.4 12/30/2020   MCV 87.1 12/30/2020   PLT 229 12/30/2020    Assessment / Plan: Induction of labor due to elective at term, no progression intial Cytotec  Labor: Patient not progressing after Cytotec x1. FB placed and additional Cytotec administered. Encouraged patient to ambulate as tolerated. Will continue to monitor expectantly.  Fetal Wellbeing:  Category I Pain Control:  IV pain meds, PRN epidural  I/D:  +GBS, PCN given  Anticipated MOD:  NSVD  01/01/2021 12/30/2020, 1:52 PM   Resident Attestation  I saw and evaluated the patient, performing the key elements of the service.I  personally performed or re-performed the history, physical exam, and medical decision making activities of this service and have verified that the service and findings are accurately documented in the student's note. I developed the management plan that is described in the medical student's note, and I agree with the content, with my edits above.    01/01/2021, PGY2

## 2020-12-30 NOTE — Progress Notes (Addendum)
Patient ID: Veronica Quinn, female   DOB: 2001/08/14, 19 y.o.   MRN: [id number] Sleeping soundly, not awakened  Vitals:   12/30/20 2007 12/30/20 2010 12/30/20 2015 12/30/20 2020  BP: 137/67     Pulse: 78     Resp: 20     Temp:      TempSrc:      SpO2: 100% 100% 100% 100%  Weight:      Height:       FHR reactive UCs regular  Dilation: 6 Effacement (%): 80 Cervical Position: Posterior Station: -2 Presentation: Vertex Exam by:: Jacqulyn Liner, RN  Will hold observe

## 2020-12-30 NOTE — H&P (Addendum)
OBSTETRIC ADMISSION HISTORY AND PHYSICAL  Veronica Quinn is a 20 y.o. female G1P0 with IUP at [redacted]w[redacted]d by 20 week ultrasound presenting for elective IOL. She reports +FMs, No LOF, no VB, no blurry vision, headaches or peripheral edema, and RUQ pain.  She plans on breast feeding. She is considering an IUD for birth control. She received her prenatal care at [place]: 20 week ultrasound --->  Estimated Date of Delivery: 01/06/21  Sono:   '@[redacted]w[redacted]d'$ , CWD, normal anatomy, cephalic presentation, vertex lie, 701g, 43% EFW, placenta anterior  Prenatal History/Complications: GBS positive  Past Medical History:  Diagnosis Date  . Anxiety   . Depression   . Headache    Past Surgical History:  Procedure Laterality Date  . NO PAST SURGERIES     OB History    Gravida  1   Para      Term      Preterm      AB      Living        SAB      IAB      Ectopic      Multiple      Live Births             Social History   Socioeconomic History  . Marital status: Single    Spouse name: Not on file  . Number of children: Not on file  . Years of education: Not on file  . Highest education level: Not on file  Occupational History  . Not on file  Tobacco Use  . Smoking status: Former Research scientist (life sciences)  . Smokeless tobacco: Never Used  Vaping Use  . Vaping Use: Never used  Substance and Sexual Activity  . Alcohol use: Never  . Drug use: Yes    Types: Marijuana  . Sexual activity: Yes    Partners: Male    Comment: pregnant   Other Topics Concern  . Not on file  Social History Narrative  . Not on file   Social Determinants of Health   Financial Resource Strain: Not on file  Food Insecurity: Not on file  Transportation Needs: Not on file  Physical Activity: Not on file  Stress: Not on file  Social Connections: Not on file   Family History  Problem Relation Age of Onset  . Diabetes Brother   . Diabetes Maternal Grandmother   . Hypertension Maternal Grandfather    No Known  Allergies  Medications Prior to Admission  Medication Sig Dispense Refill Last Dose  . Blood Pressure Monitor KIT 1 Device by Does not apply route once a week. To be monitored Regularly at home. (Patient not taking: No sig reported) 1 kit 0 More than a month at Unknown time  . cyclobenzaprine (FLEXERIL) 10 MG tablet Take 1 tablet (10 mg total) by mouth 3 (three) times daily as needed for muscle spasms. (Patient not taking: No sig reported) 30 tablet 2 More than a month at Unknown time  . Elastic Bandages & Supports (COMFORT FIT MATERNITY SUPP MED) MISC Wear daily when ambulating (Patient not taking: No sig reported) 1 each 0 More than a month at Unknown time  . omeprazole (PRILOSEC) 20 MG capsule Take 1 capsule (20 mg total) by mouth 2 (two) times daily. (Patient not taking: No sig reported) 60 capsule 2 More than a month at Unknown time  . promethazine (PHENERGAN) 25 MG tablet Take 1 tablet (25 mg total) by mouth every 6 (six) hours as needed for  nausea or vomiting. (Patient not taking: No sig reported) 30 tablet 0 More than a month at Unknown time    Review of Systems  All systems reviewed and negative except as stated in HPI  Blood pressure 115/69, pulse 100, temperature 98.2 F (36.8 C), temperature source Oral, resp. rate 18, height $RemoveBe'5\' 6"'Ylfmhscwm$  (1.676 m), weight 103 kg, last menstrual period 03/03/2020, SpO2 98 %. General appearance: alert and cooperative Lungs: clear to auscultation bilaterally Heart: regular rate and rhythm Abdomen: soft, non-tender; bowel sounds normal Extremities: Homans sign is negative, no sign of DVT  Presentation: cephalic Fetal monitoring:  Baseline: 120 bpm, Variability: Good {> 6 bpm), Accelerations: Reactive, and Decelerations: Absent Uterine activity: Frequency - Every 6-7 minutes Dilation: 1 Effacement (%): 50 Station: -3 Exam by:: Sheryle Hail, RN  Prenatal labs: ABO, Rh: B/Positive/-- (09/23 1055) Antibody: Negative (09/23 1055) Rubella: 2.91 (09/23  1055) RPR: Non Reactive (12/10 0936)  HBsAg: Negative (09/23 1055)  HIV: Non Reactive (12/10 0936)  GBS: Positive/-- (02/24 1054)  1 hr Glucola Normal Genetic screening  Normal, low risk NIPS  Anatomy US Normal   Prenatal Transfer Tool  Maternal Diabetes: No Genetic Screening: Normal Maternal Ultrasounds/Referrals: Normal Fetal Ultrasounds or other Referrals:  None Maternal Substance Abuse:  No Significant Maternal Medications:  None Significant Maternal Lab Results: Group B Strep positive  Results for orders placed or performed during the hospital encounter of 12/30/20 (from the past 24 hour(s))  CBC   Collection Time: 12/30/20  8:17 AM  Result Value Ref Range   WBC 9.5 4.0 - 10.5 K/uL   RBC 4.18 3.87 - 5.11 MIL/uL   Hemoglobin 11.6 (L) 12.0 - 15.0 g/dL   HCT 36.4 36.0 - 46.0 %   MCV 87.1 80.0 - 100.0 fL   MCH 27.8 26.0 - 34.0 pg   MCHC 31.9 30.0 - 36.0 g/dL   RDW 13.8 11.5 - 15.5 %   Platelets 229 150 - 400 K/uL   nRBC 0.0 0.0 - 0.2 %  Results for orders placed or performed during the hospital encounter of 12/29/20 (from the past 24 hour(s))  SARS CORONAVIRUS 2 (TAT 6-24 HRS) Nasopharyngeal Nasopharyngeal Swab   Collection Time: 12/29/20 10:36 AM   Specimen: Nasopharyngeal Swab  Result Value Ref Range   SARS Coronavirus 2 NEGATIVE NEGATIVE    Patient Active Problem List   Diagnosis Date Noted  . Encounter for elective induction of labor 12/30/2020  . GBS (group B Streptococcus carrier), +RV culture, currently pregnant 12/17/2020  . Urinary tract infection during pregnancy in second trimester, antepartum 08/07/2020  . Chlamydia infection affecting pregnancy 07/14/2020  . Supervision of normal pregnancy, antepartum 06/30/2020    Assessment/Plan:  Veronica Quinn is a 20 y.o. G1P0 at [redacted]w[redacted]d here for IOL  #Labor: Patient presents for elective induction of labor.  Cervix is unfavorable for Pitocin at this time.  Unable to place Foley bulb at this check.  Will give 50  mcg of Cytotec and recheck in 4 hours. #Pain: Epidural as needed #FWB: Category 1, reassuring #ID:  GBS positive, penicillin ordered #MOF: Breast #MOC: Undecided #Circ:  Yes  Gifford Shave, MD  12/30/2020, 9:18 AM  CNM attestation:  I have seen and examined this patient; I agree with above documentation in the resident's note.   Rhodie Cienfuegos is a 20 y.o. G1P0 here for elective IOL  PE: BP 121/70   Pulse 80   Temp 98.2 F (36.8 C) (Oral)   Resp 17   Ht $R'5\' 6"'Lo$  (  1.676 m)   Wt 103 kg   LMP 03/03/2020   SpO2 98%   BMI 36.64 kg/m  Gen: calm comfortable, NAD Resp: normal effort, no distress Abd: gravid  ROS, labs, PMH reviewed  Plan: -Admit to L&D -Pt understands the lengthiness of the typical IOL process, and that she may decide to stop the process & we would be in agreement at any time up until ROM, as long as her vitals are stable and the FHR is reactive. -PCN for GBS ppx -Will plan on cytotec to start and will repeat doses; add cervical foley when able; Pit and AROM later on when more favorable -Anticipate vag del  Myrtis Ser CNM 12/30/2020, 10:17 AM

## 2020-12-30 NOTE — Progress Notes (Signed)
Veronica Quinn is a 20 y.o. G1P0 at [redacted]w[redacted]d by ultrasound admitted for induction of labor due to elective at term.  Subjective: Patient endorses she is doing well, denies any pain. No concerns at this time.   Objective: BP 103/64   Pulse 89   Temp 97.6 F (36.4 C) (Axillary)   Resp 18   Ht 5\' 6"  (1.676 m)   Wt 103 kg   LMP 03/03/2020   SpO2 98%   BMI 36.64 kg/m  No intake/output data recorded. No intake/output data recorded.  FHT:  FHR: 120 bpm, variability: moderate,  accelerations:  Present,  decelerations:  Absent UC:   None SVE:   Dilation: 4 Effacement (%): 60 Station: -2 Exam by:: Cresenzo, MD2  Labs: Lab Results  Component Value Date   WBC 9.5 12/30/2020   HGB 11.6 (L) 12/30/2020   HCT 36.4 12/30/2020   MCV 87.1 12/30/2020   PLT 229 12/30/2020    Assessment / Plan: Induction of labor due to elective at term, minimal progression    Labor: Minimal progression despite Cytotec x2 and FB out @ 1530. Cervix remains elevated/posterior. Initiate Pitocin. Will continue to monitor expectantly.  Fetal Wellbeing:  Category I Pain Control:  IV pain meds, PRN epidural  I/D:  +GBS, PCN given  Anticipated MOD:  NSVD   01/01/2021 12/30/2020, 5:43 PM   Resident Attestation  I saw and evaluated the patient, performing the key elements of the service.I  personally performed or re-performed the history, physical exam, and medical decision making activities of this service and have verified that the service and findings are accurately documented in the student's note. I developed the management plan that is described in the medical student's note, and I agree with the content, with my edits above.    01/01/2021, PGY2

## 2020-12-30 NOTE — Anesthesia Procedure Notes (Signed)
Epidural Patient location during procedure: OB Start time: 12/30/2020 7:30 PM End time: 12/30/2020 7:40 PM  Staffing Anesthesiologist: Elmer Picker, MD Performed: anesthesiologist   Preanesthetic Checklist Completed: patient identified, IV checked, risks and benefits discussed, monitors and equipment checked, pre-op evaluation and timeout performed  Epidural Patient position: sitting Prep: DuraPrep and site prepped and draped Patient monitoring: continuous pulse ox, blood pressure, heart rate and cardiac monitor Approach: midline Location: L3-L4 Injection technique: LOR air  Needle:  Needle type: Tuohy  Needle gauge: 17 G Needle length: 9 cm Needle insertion depth: 5 cm Catheter type: closed end flexible Catheter size: 19 Gauge Catheter at skin depth: 10 cm Test dose: negative  Assessment Sensory level: T8 Events: blood not aspirated, injection not painful, no injection resistance, no paresthesia and negative IV test  Additional Notes Patient identified. Risks/Benefits/Options discussed with patient including but not limited to bleeding, infection, nerve damage, paralysis, failed block, incomplete pain control, headache, blood pressure changes, nausea, vomiting, reactions to medication both or allergic, itching and postpartum back pain. Confirmed with bedside nurse the patient's most recent platelet count. Confirmed with patient that they are not currently taking any anticoagulation, have any bleeding history or any family history of bleeding disorders. Patient expressed understanding and wished to proceed. All questions were answered. Sterile technique was used throughout the entire procedure. Please see nursing notes for vital signs. Test dose was given through epidural catheter and negative prior to continuing to dose epidural or start infusion. Warning signs of high block given to the patient including shortness of breath, tingling/numbness in hands, complete motor block,  or any concerning symptoms with instructions to call for help. Patient was given instructions on fall risk and not to get out of bed. All questions and concerns addressed with instructions to call with any issues or inadequate analgesia.  Reason for block:procedure for pain

## 2020-12-31 ENCOUNTER — Encounter (HOSPITAL_COMMUNITY): Payer: Self-pay | Admitting: Family Medicine

## 2020-12-31 DIAGNOSIS — Z3A39 39 weeks gestation of pregnancy: Secondary | ICD-10-CM

## 2020-12-31 MED ORDER — COCONUT OIL OIL: 1.0000 "application " | TOPICAL_OIL | Status: DC | PRN

## 2020-12-31 MED ORDER — ONDANSETRON HCL 4 MG/2ML IJ SOLN: 4.0000 mg | INTRAMUSCULAR | Status: DC | PRN

## 2020-12-31 MED ORDER — WITCH HAZEL-GLYCERIN EX PADS: 1.0000 "application " | MEDICATED_PAD | CUTANEOUS | Status: DC | PRN

## 2020-12-31 MED ORDER — TETANUS-DIPHTH-ACELL PERTUSSIS 5-2.5-18.5 LF-MCG/0.5 IM SUSY: 0.5000 mL | PREFILLED_SYRINGE | Freq: Once | INTRAMUSCULAR | Status: DC

## 2020-12-31 MED ORDER — IBUPROFEN 600 MG PO TABS
600.0000 mg | ORAL_TABLET | Freq: Four times a day (QID) | ORAL | Status: DC
  Administered 2020-12-31 – 2021-01-02 (×8): 600 mg via ORAL
  Filled 2020-12-31 (×8): qty 1

## 2020-12-31 MED ORDER — DIBUCAINE (PERIANAL) 1 % EX OINT: 1.0000 "application " | TOPICAL_OINTMENT | CUTANEOUS | Status: DC | PRN

## 2020-12-31 MED ORDER — BENZOCAINE-MENTHOL 20-0.5 % EX AERO: 1.0000 "application " | INHALATION_SPRAY | CUTANEOUS | Status: DC | PRN

## 2020-12-31 MED ORDER — ACETAMINOPHEN 325 MG PO TABS
650.0000 mg | ORAL_TABLET | ORAL | Status: DC | PRN
  Administered 2020-12-31 – 2021-01-01 (×2): 650 mg via ORAL
  Filled 2020-12-31 (×2): qty 2

## 2020-12-31 MED ORDER — LIDOCAINE HCL (PF) 1 % IJ SOLN
INTRAMUSCULAR | Status: AC
  Filled 2020-12-31: qty 30

## 2020-12-31 MED ORDER — DIPHENHYDRAMINE HCL 25 MG PO CAPS: 25.0000 mg | ORAL_CAPSULE | Freq: Four times a day (QID) | ORAL | Status: DC | PRN

## 2020-12-31 MED ORDER — PRENATAL MULTIVITAMIN CH
1.0000 | ORAL_TABLET | Freq: Every day | ORAL | Status: DC
  Administered 2020-12-31 – 2021-01-02 (×3): 1 via ORAL
  Filled 2020-12-31 (×3): qty 1

## 2020-12-31 MED ORDER — SENNOSIDES-DOCUSATE SODIUM 8.6-50 MG PO TABS
2.0000 | ORAL_TABLET | ORAL | Status: DC
Start: 2020-12-31 — End: 2021-01-02
  Administered 2020-12-31: 2 via ORAL
  Filled 2020-12-31 (×3): qty 2

## 2020-12-31 MED ORDER — ONDANSETRON HCL 4 MG PO TABS: 4.0000 mg | ORAL_TABLET | ORAL | Status: DC | PRN

## 2020-12-31 MED ORDER — SIMETHICONE 80 MG PO CHEW: 80.0000 mg | CHEWABLE_TABLET | ORAL | Status: DC | PRN

## 2020-12-31 MED ORDER — ZOLPIDEM TARTRATE 5 MG PO TABS: 5.0000 mg | ORAL_TABLET | Freq: Every evening | ORAL | Status: DC | PRN

## 2020-12-31 NOTE — Progress Notes (Signed)
Patient ID: Veronica Quinn, female   DOB: May 12, 2001, 20 y.o.   MRN: [id number] Feeling some pressure Vitals:   12/31/20 0120 12/31/20 0131 12/31/20 0204 12/31/20 0231  BP:  122/86 138/72 (!) 141/74  Pulse:  (!) 109 96 100  Resp: 20  18   Temp:      TempSrc:      SpO2:      Weight:      Height:       FHR stable UCs q2-54min  Dilation: 10 Dilation Complete Date: 12/31/20 Dilation Complete Time: 0244 Effacement (%): 100 Cervical Position: Posterior Station: Plus 1 Presentation: Vertex Exam by:: Jacqulyn Liner, RN  I think her station is actually closer to -1  Pushing effort was poor Will labor her down for a while then retry pushing

## 2020-12-31 NOTE — Discharge Summary (Addendum)
Postpartum Discharge Summary  Date of Service updated 01/01/21    Patient Name: Veronica Quinn DOB: [birth year] MRN: [id number]  Date of admission: 12/30/2020 Delivery date:12/31/2020  Delivering provider: Seabron Spates  Date of discharge: 01/01/2021  Admitting diagnosis: Encounter for induction of labor [Z34.90] Intrauterine pregnancy: [redacted]w[redacted]d     Secondary diagnosis:  Active Problems:   GBS (group B Streptococcus carrier), +RV culture, currently pregnant   Encounter for elective induction of labor   Vaginal delivery  Additional problems: fetal heart rate decelerations, nuchal cord x 2 with body cord x1    Discharge diagnosis: Term Pregnancy Delivered and Fetal heart rate decelerations, nuchal cord x2                                                Post partum procedures: None  Augmentation: AROM, Pitocin, Cytotec and IP Foley Complications: None  Hospital course: Induction of Labor With Vaginal Delivery   20 y.o. yo Veronica Quinn at [redacted]w[redacted]d was admitted to the hospital 12/30/2020 for induction of labor.  Indication for induction: Favorable cervix at term.  Patient had an uncomplicated labor course as follows: Membrane Rupture Time/Date: 10:12 PM ,12/30/2020   Delivery Method:Vaginal, Spontaneous  Episiotomy: None  Lacerations:  2nd degree;Labial  Details of delivery can be found in separate delivery note.  Patient had a routine postpartum course. Patient is discharged home 01/01/21.  Newborn Data: Birth date:12/31/2020  Birth time:4:22 AM  Gender:Female  Living status:Living  Apgars:8 ,9  Weight:2946 g   Magnesium Sulfate received: No BMZ received: No Rhophylac:N/A MMR:No T-DaP:Given prenatally Flu: No Transfusion:No  Physical exam  Vitals:   12/31/20 1145 12/31/20 1612 12/31/20 2028 01/01/21 0513  BP: (!) 118/52 110/70 132/74 113/71  Pulse: 82 93 97 84  Resp: $Remo'18 18 20 18  'VZwNn$ Temp: 98 F (36.7 C) 97.7 F (36.5 C) 99.2 F (37.3 C) 98.7 F (37.1 C)  TempSrc: Oral  Oral Oral   SpO2:  100% 99% 100%  Weight:      Height:       General: alert and cooperative Lochia: appropriate Uterine Fundus: firm Incision: N/A DVT Evaluation: No evidence of DVT seen on physical exam. Labs: Lab Results  Component Value Date   WBC 9.5 12/30/2020   HGB 11.6 (L) 12/30/2020   HCT 36.4 12/30/2020   MCV 87.1 12/30/2020   PLT 229 12/30/2020   No flowsheet data found. Edinburgh Score: Edinburgh Postnatal Depression Scale Screening Tool 12/31/2020  I have been able to laugh and see the funny side of things. (No Data)      After visit meds:  Allergies as of 01/01/2021   No Known Allergies      Medication List     TAKE these medications    Blood Pressure Monitor Kit 1 Device by Does not apply route once a week. To be monitored Regularly at home.   calcium carbonate 500 MG chewable tablet Commonly known as: TUMS - dosed in mg elemental calcium Chew 1 tablet by mouth daily as needed for indigestion or heartburn.   ibuprofen 600 MG tablet Commonly known as: ADVIL Take 1 tablet (600 mg total) by mouth every 6 (six) hours.   omeprazole 20 MG capsule Commonly known as: PRILOSEC Take 1 capsule (20 mg total) by mouth 2 (two) times daily.       Discharge home  in stable condition Message sent to [facility] by Berniece Andreas MD on 3/17   Infant Feeding: Breast Infant Disposition:home with mother Discharge instruction: per After Visit Summary and Postpartum booklet. Activity: Advance as tolerated. Pelvic rest for 6 weeks.  Diet: routine diet Anticipated Birth Control: Unsure, wants to discuss at next PCP visit Postpartum Appointment: 6 weeks Additional Postpartum F/U: Postpartum Depression checkup in two weeks given history of anxiety/depression    01/01/2021 [name] PGY-1   I saw and evaluated the patient. I agree with the findings and the plan of care as documented in the resident's note.  Sharene Skeans, MD [facility] Fellow, [facility] for  [facility], [place] of Attending Supervision of Advanced Practice Provider (PA/CNM/NP): Evaluation and management procedures were performed by the Advanced Practice Provider under my supervision and collaboration.  I have reviewed the Advanced Practice Provider's note and chart, and I agree with the management and plan. I have also made any necessary editorial changes.   Annalee Genta, DO Attending [place], [facility] for [facility], [place] [facility] 01/01/2021 1:33 PM

## 2020-12-31 NOTE — Progress Notes (Signed)
Patient ID: Veronica Quinn, female   DOB: 26-Feb-2001, 20 y.o.   MRN: [id number] Doing well Vitals:   12/31/20 0112 12/31/20 0120 12/31/20 0131 12/31/20 0204  BP:   122/86 138/72  Pulse:   (!) 109 96  Resp: 18 20  18   Temp: 98.3 F (36.8 C)     TempSrc: Oral     SpO2:      Weight:      Height:       FHR has been stable and pt has made some progress with cervix UCs have spaced out and are milder  Will restart Pitocin to enhance labor pattern.

## 2020-12-31 NOTE — Lactation Note (Addendum)
This note was copied from a baby's chart. Lactation Consultation Note  Patient Name: Veronica Quinn [id number] Date: 12/31/2020 Reason for consult: Follow-up assessment;Mother's request;Difficult latch;Primapara;1st time breastfeeding;Term Age:21 hours On arrival, Mom offering [employer] with yellow slow flow nipple. Infant gagging and milk leaking out the sides. LC then switched to purple extra slow flow and placed infant sideline position, still gagging. LC called for RN, Alana Mazyck, infant pushing nipple out with tongue, chewing on the nipple.   LC did suck training earlier and infant using gums during suck. He can extend his tongue pass the gumline. He has a high arched palate, infant struggling with all attempts to bottlefeed.   Mom's nipples are erect with no signs of compression or trauma. With compression, colostrum leaking from the breasts. Infant will not sustain latch at the breast looking for faster flow tends to pop off. Mom has pendulous breasts hard for her to support on her own in a latch. LC set up an 5 french feeding tube with syringe on 20 NS, infant able to transfer 4 ml better than at the breast but still reacting to flow.  RN to alert Pediatrician and follow up with provider in am.  Mom able to offer EBM via spoon or finger feeding. RN to provide additional support.   LC talked with RN Mom can try sideline with a bed roll behind the infant to see easier to latch in this position.  Mom has her own personal pump but only has one flange size that is not comfortable for her.   LC set up DEBP fitted 27 flange.  Mom to use pump every 3 hrs for 15 minutes any EBM collected Mom to offer with RN assistance via spoon or finger feeding.   Mom need assistance to use 20 NS with 5 french if she is unable to get infant to latch in sideline. RN aware of feeding plan.    Maternal Data    Feeding Mother's Current Feeding Choice: Breast Milk and Formula Nipple Type: Slow -  flow  LATCH Score Latch: Repeated attempts needed to sustain latch, nipple held in mouth throughout feeding, stimulation needed to elicit sucking reflex.  Audible Swallowing: A few with stimulation  Type of Nipple: Everted at rest and after stimulation  Comfort (Breast/Nipple): Soft / non-tender  Hold (Positioning): Full assist, staff holds infant at breast  LATCH Score: 6   Lactation Tools Discussed/Used Tools: Nipple Shields Nipple shield size: 24;20 Pump Education: Setup, frequency, and cleaning;Milk Storage Reason for Pumping: Infant using SNS with nipple shield Pumping frequency: every 3 hrs for 15 minutes  Interventions Interventions: Breast feeding basics reviewed;Assisted with latch;Skin to skin;Support pillows;Breast compression;Adjust position;Breast massage;Position options;Hand express;Expressed milk;Education  Discharge Pump: Personal WIC Program: Yes  Consult Status Consult Status: Follow-up Date: 01/01/21 Follow-up type: In-patient    [name]  Nicholson-Springer 12/31/2020, 9:32 PM

## 2020-12-31 NOTE — Progress Notes (Signed)
Pt requested a muscle relaxer. RN called MD. MD stated try tylenol and heat 1st.

## 2020-12-31 NOTE — Lactation Note (Signed)
This note was copied from a baby's chart. Lactation Consultation Note  Patient Name: Veronica Quinn [id number] Date: 12/31/2020 Reason for consult: Initial assessment;Mother's request;Difficult latch;Primapara;1st time breastfeeding;Term Age: 11hrs   Infant sleepy at the breasts. Mom set up on a manual pump collected 7 ml of EBM. Infant spoon/ 2 ml.  Infant will not latch. With suck training, his lips are tight very little access into his mouth.   Mom told to delay the bath until he has a good feeding. LC to alert RN, Candace Cruise, Mom needs assistance latching baby before next feeding.   Mom's nipples are erect with no signs of nipple trauma or breast compression. Mom also has her own electric pump in the room.   Plan 1 To feed based on cues 8-12x  In 24 hrs no more than 3hrs without an attempt. Mom to offer both breasts and look for signs of milk transfer. If infant does not latch, Mom to offer EBM via spoon.          2. Manual pump q 3hrs for 15 minutes         3. I and O sheet reviewed         4. [facility] brochure of inpatient and outpatient services reviewed.  All questions answered at the end of the visit.   Maternal Data Has patient been taught Hand Expression?: Yes Does the patient have breastfeeding experience prior to this delivery?: No  Feeding Mother's Current Feeding Choice: Breast Milk  LATCH Score Latch: Too sleepy or reluctant, no latch achieved, no sucking elicited.  Audible Swallowing: None  Type of Nipple: Everted at rest and after stimulation  Comfort (Breast/Nipple): Soft / non-tender  Hold (Positioning): Assistance needed to correctly position infant at breast and maintain latch.  LATCH Score: 5   Lactation Tools Discussed/Used Tools: Pump;Flanges Flange Size: 27 Breast pump type: Manual Pump Education: Setup, frequency, and cleaning;Milk Storage Reason for Pumping: increase stimulation Pumping frequency: every 3 hrs for 15  minutes  Interventions Interventions: Breast feeding basics reviewed;Breast compression;Hand pump;Skin to skin;Support pillows;Breast massage;Position options;Hand express;Expressed milk;Education;Pre-pump if needed;Assisted with latch;Adjust position  Discharge Pump: Manual WIC Program: Yes  Consult Status Consult Status: Follow-up Date: 01/01/21 Follow-up type: In-patient    Apple Dearmas  Nicholson-Springer 12/31/2020, 5:03 PM

## 2020-12-31 NOTE — Anesthesia Postprocedure Evaluation (Signed)
Anesthesia Post Note  Patient: Veronica Quinn  Procedure(s) Performed: AN AD HOC LABOR EPIDURAL     Patient location during evaluation: Mother Baby Anesthesia Type: Epidural Level of consciousness: awake and alert, oriented and patient cooperative Pain management: pain level controlled Vital Signs Assessment: post-procedure vital signs reviewed and stable Respiratory status: spontaneous breathing Cardiovascular status: stable Postop Assessment: no headache, epidural receding, patient able to bend at knees and no signs of nausea or vomiting Anesthetic complications: no Comments: Pain score 0.    No complications documented.  Last Vitals:  Vitals:   12/31/20 0652 12/31/20 0805  BP: 126/69 140/72  Pulse: 89 88  Resp: 16   Temp: (!) 38 C 37.4 C  SpO2: 100%     Last Pain:  Vitals:   12/31/20 0805  TempSrc: Oral  PainSc:    Pain Goal:                   [facility]

## 2021-01-01 MED ORDER — IBUPROFEN 600 MG PO TABS: 600.0000 mg | ORAL_TABLET | Freq: Four times a day (QID) | ORAL | 0 refills | Status: DC

## 2021-01-01 NOTE — Lactation Note (Signed)
This note was copied from a baby's chart. Lactation Consultation Note  Patient Name: Veronica Quinn [id number] Date: 01/01/2021 Reason for consult: Follow-up assessment Age:20 years   Mother reports that she last fed infant 8 ml of ebm with a bottle.  Infant is at 6% wt loss. He is 21 hours old and is not taking adequate  volumes. Mother has supplemental guidelines but is unable to get infant to take more.   Mother reports that she has her own pump the "[address]"..  She reports that she plans to get formula from [facility] in case she doesn't make enough milk.  Discussed supply and demand with mother and   Mother reports that she doesn't think she wants to continue to pump. Discussed benefits of breastmilk for infant. Gave support and understanding . Mother  reports that she has not been pumping every 3 hours that her milk just leaks out.   Discussed with staff nurse,  Vivi Martens RN mother's intent to just formula feed . Also that infant is not taking adequate volumes. Suggested that speech see infant and evaluate for suck and swallow behaviors.      Maternal Data    Feeding Mother's Current Feeding Choice: Breast Milk Nipple Type: Extra Slow Flow  LATCH Score                    Lactation Tools Discussed/Used    Interventions    Discharge Discharge Education: Engorgement and breast care Pump: Personal Dia Sitter [phone])  Consult Status Consult Status: Complete (mothers request)    Michel Bickers 01/01/2021, 12:30 PM

## 2021-01-01 NOTE — Clinical Social Work Maternal (Signed)
CLINICAL SOCIAL WORK MATERNAL/CHILD NOTE  Patient Details  Name: Veronica Quinn MRN: [id number] Date of Birth: [birth year]  Date:  01/01/2021  Clinical Social Worker Initiating Note:  Kathrin Greathouse Date/Time: Initiated:  01/01/21/      Child's Name:  Veronica Quinn   Biological Parents:  Mother   Need for Interpreter:  None   Reason for Referral:  Current Substance Use/Substance Use During Pregnancy ,Behavioral Health Concerns   Address:  [address] [place] [address]    Phone number:  [phone] (home)     Additional phone number:   Household Members/Support Persons (HM/SP):   Household Member/Support Person 1   HM/SP Name Relationship DOB or Age  HM/SP -1 Veronica Quinn Mother (Biological) 05-16-1979  HM/SP -2        HM/SP -3        HM/SP -4        HM/SP -5        HM/SP -6        HM/SP -7        HM/SP -8          Natural Supports (not living in the home):  Immediate Family   Professional Supports:     Employment: Unemployed   Type of Work:     Education:  [place] arranged:  N/A  Museum/gallery curator Resources:  Medicaid   Other Resources:  Physicist, medical ,[place] Considerations Which May Impact Care:  N/A  Strengths:  Ability to meet basic needs ,Home prepared for child    Psychotropic Medications:    none     Pediatrician:      [facility] [employer] [facility]  Pediatrician List:   [place]      Pediatrician Fax Number:   Risk Factors/Current Problems:  Substance Use ,Mental Health Concerns    Cognitive State:  Alert ,Linear Thinking ,Able to Concentrate    Mood/Affect:  Calm ,Comfortable    CSW Assessment:  CSW met with the MOB at bedside. CSW introduced role.  MOB receptive to the [place] visit. MOB sitting up in bed and with a visitor. MOB reports the  visitor was her sister. CSW asked MOB if she wanted her sister to leave to give her privacy, sister agreeable to leave. CSW noticed baby was not in the room. MOB reports the baby was having a circumcision done at the time.  CSW explain the reason for the visit was to discuss her hx of anxiety, depression and to notify the MOB about hospital drug screen policy and offer resources. CSW asked MOB how she is feeling. MOB reports, "I feel fine." CSW inquired about any feeling of sadness. MOB reports, " I had a breakdown earlier today." MOB express moments of tearfulness .MOB reports having breakdowns throughout her pregnancy. MOB reports she has not sought therapy or medications for her anxiety or depression.  MOB feels the breakdowns were normal due to the change in her hormones. CSW provided active listening and educated MOB about the [employer], Post partum depression and provided MOB with a checklist to monitor her symptoms and to reach out to her medical provider if she starts showing signs of post partum depression. MOB reports understanding. CSW provided MOB with a list of of mental health crisis resources and therapy  in the [place] area. CSW encouraged MOB to follow up with a counselor or therapist in the area if she continues to have "breakdowns." MOB denies feeling SI/HI. CSW inquired about MOB supports. MOB reports her sister and biological mother who lives in the home with her will help care for the baby. CSW provided education on SIDS. MOB reports understanding. CSW inquired about the MOB substance use history. MOB reports only using marijuana once during her pregnancy and that was in August 2021. CSW explain the [place] drug screen policy and that Child Protective Services will be contacted if the baby's urine and/or umbilical cord result positive. MOB reports understanding the policy. CSW asked about the chosen Pediatrician for baby. MOB reports she chose [facility]. CSW asked  about transportation needs. MOB denies any transportation needs. CSW inquired about items for the baby. MOB reports she has a car seat and crib for the baby. CSW assessed for further needs. No further needs identified at this time.   CSW Plan/Description:  Hospital Drug Screen Policy Information,Perinatal Mood and Anxiety Disorder (PMADs) Education,Sudden Infant Death Syndrome (SIDS) Education,No Further Intervention Required/No Barriers to [place] Protective Service Report ,CSW Will Continue to Monitor Umbilical Cord Tissue Drug Screen Results and Make Report if Warranted,Other Information/Referral to [employer], LCSW 01/01/2021, 11:55 AM

## 2021-01-02 NOTE — Discharge Summary (Signed)
Postpartum Discharge Summary  Date of Service updated 01/02/21    Patient Name: Veronica Quinn DOB: July 27, 2001 MRN: [id number]  Date of admission: 12/30/2020 Delivery date:12/31/2020  Delivering provider: Seabron Spates  Date of discharge: 01/02/2021  Admitting diagnosis: Encounter for induction of labor [Z34.90] Intrauterine pregnancy: [redacted]w[redacted]d    Secondary diagnosis:  Active Problems:   GBS (group B Streptococcus carrier), +RV culture, currently pregnant   Encounter for elective induction of labor   Vaginal delivery  Additional problems: fetal heart rate decelerations, nuchal cord x 2 with body cord x1    Discharge diagnosis: Term Pregnancy Delivered and Fetal heart rate decelerations, nuchal cord x2                                                Post partum procedures: None  Augmentation: AROM, Pitocin, Cytotec and IP Foley Complications: None  Hospital course: Induction of Labor With Vaginal Delivery   20y.o. yo G1P0 at 365w1das admitted to the hospital 12/30/2020 for induction of labor.  Indication for induction: Favorable cervix at term.  Patient had an uncomplicated labor course as follows: Membrane Rupture Time/Date: 10:12 PM ,12/30/2020   Delivery Method:Vaginal, Spontaneous  Episiotomy: None  Lacerations:  2nd degree;Labial  Details of delivery can be found in separate delivery note.  Patient had a routine postpartum course. Patient is discharged home 01/02/21.  Newborn Data: Birth date:12/31/2020  Birth time:4:22 AM  Gender:Female  Living status:Living  Apgars:8 ,9  Weight:2946 g   Magnesium Sulfate received: No BMZ received: No Rhophylac:N/A MMR:No T-DaP:Given prenatally Flu: No Transfusion:No  Physical exam  Vitals:   01/01/21 0513 01/01/21 1447 01/01/21 2026 01/02/21 0539  BP: 113/71 112/61 115/73 110/64  Pulse: 84 81 95 74  Resp: _0 Temp: 98.7 F (37.1 C) 98.9 F (37.2 C) 98 F (36.7 C) 98.1 F (36.7 C)  TempSrc: Oral Oral Oral Oral   SpO2: 100% 100% 100% 100%  Weight:      Height:       General: alert and cooperative Lochia: appropriate Uterine Fundus: firm Incision: N/A DVT Evaluation: No evidence of DVT seen on physical exam. Labs: Lab Results  Component Value Date   WBC 9.5 12/30/2020   HGB 11.6 (L) 12/30/2020   HCT 36.4 12/30/2020   MCV 87.1 12/30/2020   PLT 229 12/30/2020   No flowsheet data found. Edinburgh Score: Edinburgh Postnatal Depression Scale Screening Tool 01/02/2021  I have been able to laugh and see the funny side of things. 0  I have looked forward with enjoyment to things. 0  I have blamed myself unnecessarily when things went wrong. 2  I have been anxious or worried for no good reason. 2  I have felt scared or panicky for no good reason. 2  Things have been getting on top of me. 1  I have been so unhappy that I have had difficulty sleeping. 2  I have felt sad or miserable. 2  I have been so unhappy that I have been crying. 1  The thought of harming myself has occurred to me. 0  Edinburgh Postnatal Depression Scale Total 12      After visit meds:  Allergies as of 01/02/2021   No Known Allergies     Medication List    TAKE these medications   Blood  Pressure Monitor Kit 1 Device by Does not apply route once a week. To be monitored Regularly at home.   calcium carbonate 500 MG chewable tablet Commonly known as: TUMS - dosed in mg elemental calcium Chew 1 tablet by mouth daily as needed for indigestion or heartburn.   ibuprofen 600 MG tablet Commonly known as: ADVIL Take 1 tablet (600 mg total) by mouth every 6 (six) hours.   omeprazole 20 MG capsule Commonly known as: PRILOSEC Take 1 capsule (20 mg total) by mouth 2 (two) times daily.      Discharge home in stable condition Message sent to [facility] by Berniece Andreas MD on 3/17   Infant Feeding: Breast Infant Disposition:home with mother Discharge instruction: per After Visit Summary and Postpartum booklet. Activity:  Advance as tolerated. Pelvic rest for 6 weeks.  Diet: routine diet Anticipated Birth Control: Unsure, wants to discuss at next PCP visit Postpartum Appointment: 6 weeks Additional Postpartum F/U: Postpartum Depression checkup in two weeks given history of anxiety/depression    01/02/2021   Sharene Skeans, MD [place] Fellow, [facility] for [employer], [place]

## 2021-01-11 ENCOUNTER — Encounter (HOSPITAL_COMMUNITY): Payer: Self-pay | Admitting: Emergency Medicine

## 2021-01-11 ENCOUNTER — Emergency Department (HOSPITAL_COMMUNITY)
Admission: EM | Admit: 2021-01-11 | Discharge: 2021-01-11 | Disposition: A | Discharge status: Home / Self Care | Payer: Medicaid Other | Attending: Emergency Medicine | Admitting: Emergency Medicine

## 2021-01-11 DIAGNOSIS — R222 Localized swelling, mass and lump, trunk: Secondary | ICD-10-CM | POA: Diagnosis present

## 2021-01-11 DIAGNOSIS — Z87891 Personal history of nicotine dependence: Secondary | ICD-10-CM | POA: Diagnosis not present

## 2021-01-11 DIAGNOSIS — L0501 Pilonidal cyst with abscess: Secondary | ICD-10-CM

## 2021-01-11 MED ORDER — SULFAMETHOXAZOLE-TRIMETHOPRIM 800-160 MG PO TABS: 1.0000 | ORAL_TABLET | Freq: Two times a day (BID) | ORAL | 0 refills | Status: AC

## 2021-01-11 MED ORDER — LIDOCAINE-EPINEPHRINE 1 %-1:100000 IJ SOLN
10.0000 mL | Freq: Once | INTRAMUSCULAR | Status: AC
  Administered 2021-01-11: 10 mL
  Filled 2021-01-11: qty 1

## 2021-01-11 MED ORDER — SULFAMETHOXAZOLE-TRIMETHOPRIM 800-160 MG PO TABS
1.0000 | ORAL_TABLET | Freq: Once | ORAL | Status: AC
  Administered 2021-01-11: 1 via ORAL
  Filled 2021-01-11: qty 1

## 2021-01-11 MED ORDER — ACETAMINOPHEN 500 MG PO TABS
1000.0000 mg | ORAL_TABLET | Freq: Once | ORAL | Status: AC
  Administered 2021-01-11: 1000 mg via ORAL
  Filled 2021-01-11: qty 2

## 2021-01-11 NOTE — ED Triage Notes (Signed)
Pt reports abscess on buttocks since yesterday.

## 2021-01-11 NOTE — ED Notes (Signed)
Patient left without waiting for discharge papers.

## 2021-01-11 NOTE — ED Provider Notes (Signed)
[facility] EMERGENCY DEPARTMENT Provider Note   CSN: [id number] Arrival date & time: 01/11/21  1635     History Chief Complaint  Patient presents with  . Abscess    Veronica Quinn is a 20 y.o. female presents to the ED for evaluation of abscess above her buttocks.  She noticed mild pain 2 days ago and this is worsened.  Reports having an abscess in this area 2-3 times in the past and thinks that it has returned.  She thinks this time she got it earlier.  Has needed incision and drainage in the past as well as packing.  No interventions.  Pain is worse with sitting, palpation.  No drainage, fevers.  Patient had recent vaginal delivery 1 week ago.  She is not breast-feeding.  HPI     Past Medical History:  Diagnosis Date  . Anxiety   . Depression   . Headache     Patient Active Problem List   Diagnosis Date Noted  . Vaginal delivery 12/31/2020  . Encounter for elective induction of labor 12/30/2020  . GBS (group B Streptococcus carrier), +RV culture, currently pregnant 12/17/2020  . Urinary tract infection during pregnancy in second trimester, antepartum 08/07/2020  . Chlamydia infection affecting pregnancy 07/14/2020  . Supervision of normal pregnancy, antepartum 06/30/2020    Past Surgical History:  Procedure Laterality Date  . NO PAST SURGERIES       OB History    Gravida  1   Para  1   Term  1   Preterm      AB      Living  1     SAB      IAB      Ectopic      Multiple  0   Live Births  1           Family History  Problem Relation Age of Onset  . Diabetes Brother   . Diabetes Maternal Grandmother   . Hypertension Maternal Grandfather     Social History   Tobacco Use  . Smoking status: Former Research scientist (life sciences)  . Smokeless tobacco: Never Used  Vaping Use  . Vaping Use: Never used  Substance Use Topics  . Alcohol use: Never  . Drug use: Yes    Types: Marijuana    Home Medications Prior to Admission medications    Medication Sig Start Date End Date Taking? Authorizing Provider  sulfamethoxazole-trimethoprim (BACTRIM DS) 800-160 MG tablet Take 1 tablet by mouth 2 (two) times daily for 7 days. 01/11/21 01/18/21 Yes Kinnie Feil, PA-C  Blood Pressure Monitor KIT 1 Device by Does not apply route once a week. To be monitored Regularly at home. 06/30/20   Constant, Peggy, MD  calcium carbonate (TUMS - DOSED IN MG ELEMENTAL CALCIUM) 500 MG chewable tablet Chew 1 tablet by mouth daily as needed for indigestion or heartburn.    [provider]  ibuprofen (ADVIL) 600 MG tablet Take 1 tablet (600 mg total) by mouth every 6 (six) hours. 01/01/21   Muus, Gwynn Burly, MD  omeprazole (PRILOSEC) 20 MG capsule Take 1 capsule (20 mg total) by mouth 2 (two) times daily. Patient not taking: No sig reported 10/06/20   Shelly Bombard, MD    Allergies    Patient has no known allergies.  Review of Systems   Review of Systems  Skin:       Abscess  All other systems reviewed and are negative.   Physical Exam Updated  Vital Signs BP 121/81 (BP Location: Left Arm)   Pulse 90   Temp 98.4 F (36.9 C)   Resp 16   SpO2 99%   Physical Exam Constitutional:      Appearance: She is well-developed.  HENT:     Head: Normocephalic.     Nose: Nose normal.  Eyes:     General: Lids are normal.  Cardiovascular:     Rate and Rhythm: Normal rate.  Pulmonary:     Effort: Pulmonary effort is normal. No respiratory distress.  Musculoskeletal:        General: Normal range of motion.     Cervical back: Normal range of motion.  Skin:    Comments: Tender, mostly firm nodule versus abscess in gluteal cleft.  Central fluctuance.  Previous linear incision and drainage scars noted in this area.  No significant skin erythema.  Perianal skin otherwise normal.  Neurological:     Mental Status: She is alert.  Psychiatric:        Behavior: Behavior normal.     ED Results / Procedures / Treatments   Labs (all labs ordered  are listed, but only abnormal results are displayed) Labs Reviewed - No data to display  EKG None  Radiology No results found.  Procedures .Marland KitchenIncision and Drainage  Date/Time: 01/11/2021 10:01 PM Performed by: Kinnie Feil, PA-C Authorized by: Kinnie Feil, PA-C   Consent:    Consent obtained:  Verbal   Consent given by:  Patient   Risks discussed:  Bleeding, incomplete drainage, pain and damage to other organs   Alternatives discussed:  No treatment Universal protocol:    Procedure explained and questions answered to patient or proxy's satisfaction: yes     Relevant documents present and verified: yes     Required blood products, implants, devices, and special equipment available: yes     Site/side marked: yes     Immediately prior to procedure, a time out was called: yes     Patient identity confirmed:  Verbally with patient Location:    Type:  Pilonidal cyst (Abscess) Pre-procedure details:    Skin preparation:  Betadine Anesthesia:    Anesthesia method:  Local infiltration   Local anesthetic:  Lidocaine 1% WITH epi Procedure type:    Complexity:  Simple Procedure details:    Incision types:  Single straight   Incision depth:  Subcutaneous   Wound management:  Probed and deloculated, irrigated with saline and extensive cleaning   Drainage:  Purulent and bloody   Drainage amount:  Scant   Packing materials:  1/4 in gauze Post-procedure details:    Procedure completion:  Tolerated well, no immediate complications     Medications Ordered in ED Medications  lidocaine-EPINEPHrine (XYLOCAINE W/EPI) 1 %-1:100000 (with pres) injection 10 mL (10 mLs Infiltration Given 01/11/21 2015)  sulfamethoxazole-trimethoprim (BACTRIM DS) 800-160 MG per tablet 1 tablet (1 tablet Oral Given 01/11/21 2017)  acetaminophen (TYLENOL) tablet 1,000 mg (1,000 mg Oral Given 01/11/21 2017)    ED Course  I have reviewed the triage vital signs and the nursing notes.  Pertinent labs  & imaging results that were available during my care of the patient were reviewed by me and considered in my medical decision making (see chart for details).    MDM Rules/Calculators/A&P                          20 year old female presents to the ED for recurrent pilonidal abscess.  This is small today.  Mostly firm with central fluctuance.  No significant cellulitis.  No systemic symptoms.  Incision and drainage performed at bedside with scant amount of bloody purulent drainage.  There were no immediate complications.  Packing placed.  Discussed wound care at home.  Will discharge with Bactrim, NSAIDs, warm compresses.  Patient is not breast-feeding.  Return precautions discussed.  She is comfortable with the plan of care.  Final Clinical Impression(s) / ED Diagnoses Final diagnoses:  Pilonidal abscess    Rx / DC Orders ED Discharge Orders         Ordered    sulfamethoxazole-trimethoprim (BACTRIM DS) 800-160 MG tablet  2 times daily        01/11/21 2158           Arlean Hopping 01/11/21 2204    Hayden Rasmussen, MD 01/12/21 1025

## 2021-01-11 NOTE — Discharge Instructions (Signed)
You were seen in the emergency department for an abscess  This was incised and drained  There was only a small amount of pus and blood that came out of the abscess  Lease keep the packing in place for the next 24 to 48 hours.  After this you may remove it at home.  Put a warm rag on the area and try to massage in the shower under warm water to help remove any pus or blood.  Symptoms should improve after 48 hours of antibiotics.  You can take 600 mg of ibuprofen every 6-8 hours for pain.  You can add 650 to 1000 mg of acetaminophen every 6-8 hours for more pain control.  Take Bactrim which is an antibiotic in the morning and at night.  Return to the ED for worsening pain, swelling, fevers, chills

## 2021-01-13 ENCOUNTER — Encounter: Payer: Medicaid Other | Admitting: Licensed Clinical Social Worker

## 2021-01-28 ENCOUNTER — Ambulatory Visit: Payer: Medicaid Other | Admitting: Women's Health

## 2021-02-12 ENCOUNTER — Ambulatory Visit: Payer: Medicaid Other | Admitting: Obstetrics

## 2021-03-04 ENCOUNTER — Telehealth: Payer: Self-pay

## 2021-03-23 ENCOUNTER — Ambulatory Visit: Payer: Medicaid Other | Admitting: Obstetrics

## 2021-04-30 ENCOUNTER — Ambulatory Visit: Payer: Medicaid Other

## 2021-05-19 ENCOUNTER — Inpatient Hospital Stay
Admit: 2021-05-19 | Discharge: 2021-05-19 | Disposition: A | Discharge status: Home / Self Care | Payer: Medicaid (Managed Care) | Attending: Emergency Medicine

## 2021-05-19 DIAGNOSIS — N3000 Acute cystitis without hematuria: Secondary | ICD-10-CM

## 2021-05-19 LAB — COMPREHENSIVE METABOLIC PANEL
ALT: 21 U/L (ref 12–65)
AST: 37 U/L (ref 15–37)
Albumin/Globulin Ratio: 0.8 — ABNORMAL LOW (ref 1.2–3.5)
Albumin: 3.3 g/dL — ABNORMAL LOW (ref 3.5–5.0)
Alk Phosphatase: 47 U/L — ABNORMAL LOW (ref 50–136)
Anion Gap: 5 mmol/L — ABNORMAL LOW (ref 7–16)
BUN: 12 MG/DL (ref 6–23)
CO2: 23 mmol/L (ref 21–32)
Calcium: 8.5 MG/DL (ref 8.3–10.4)
Chloride: 111 mmol/L — ABNORMAL HIGH (ref 98–107)
Creatinine: 1.2 MG/DL — ABNORMAL HIGH (ref 0.6–1.0)
GFR African American: >60 mL/min/{1.73_m2} (ref >60)
GFR Non-African American: >60 mL/min/{1.73_m2} (ref >60)
Globulin: 3.9 g/dL — ABNORMAL HIGH (ref 2.3–3.5)
Glucose: 105 mg/dL — ABNORMAL HIGH (ref 65–100)
Potassium: 4.4 mmol/L (ref 3.5–5.1)
Sodium: 139 mmol/L (ref 136–145)
Total Bilirubin: 0.4 MG/DL (ref 0.2–1.1)
Total Protein: 7.2 g/dL (ref 6.3–8.2)

## 2021-05-19 LAB — URINALYSIS
Bilirubin Urine: NEGATIVE
Glucose, UA: NEGATIVE mg/dL
Ketones, Urine: NEGATIVE mg/dL
Nitrite, Urine: NEGATIVE
Protein, UA: 30 mg/dL — AB
Specific Gravity, UA: 1.011 (ref 1.001–1.023)
Urobilinogen, Urine: 0.2 EU/dL (ref 0.2–1.0)
pH, Urine: 5.5 (ref 5.0–9.0)

## 2021-05-19 LAB — CBC WITH AUTO DIFFERENTIAL
Absolute Eos #: 0.1 10*3/uL (ref 0.0–0.8)
Absolute Immature Granulocyte: 0 10*3/uL (ref 0.0–0.5)
Absolute Lymph #: 1.7 10*3/uL (ref 0.5–4.6)
Absolute Mono #: 0.8 10*3/uL (ref 0.1–1.3)
Basophils Absolute: 0.1 10*3/uL (ref 0.0–0.2)
Basophils: 1 % (ref 0.0–2.0)
Eosinophils %: 1 % (ref 0.5–7.8)
Hematocrit: 37.1 % (ref 35.8–46.3)
Hemoglobin: 11.6 g/dL — ABNORMAL LOW (ref 11.7–15.4)
Immature Granulocytes: 0 % (ref 0.0–5.0)
Lymphocytes: 19 % (ref 13–44)
MCH: 27.1 PG (ref 26.1–32.9)
MCHC: 31.3 g/dL — ABNORMAL LOW (ref 31.4–35.0)
MCV: 86.7 FL (ref 79.6–97.8)
MPV: 11.1 FL (ref 9.4–12.3)
Monocytes: 9 % (ref 4.0–12.0)
Platelets: 317 10*3/uL (ref 150–450)
RBC: 4.28 M/uL (ref 4.05–5.2)
RDW: 14.5 % (ref 11.9–14.6)
Seg Neutrophils: 70 % (ref 43–78)
Segs Absolute: 6.7 10*3/uL (ref 1.7–8.2)
WBC: 9.3 10*3/uL (ref 4.3–11.1)
nRBC: 0 10*3/uL (ref 0.0–0.2)

## 2021-05-19 LAB — POCT URINALYSIS DIPSTICK
Bilirubin, Urine, POC: NEGATIVE
Glucose, UA POC: NEGATIVE mg/dL
Ketones, Urine, POC: NEGATIVE mg/dL
Nitrite, Urine, POC: NEGATIVE
Protein, Urine, POC: 30 mg/dL — AB
Specific Gravity, Urine, POC: 1.015 (ref 1.001–1.023)
URINE UROBILINOGEN POC: 0.2 EU/dL (ref 0.2–1.0)
pH, Urine, POC: 5.5 (ref 5.0–9.0)

## 2021-05-19 LAB — WET PREP, GENITAL
Wet Prep: 10
Wet Prep: NONE SEEN
Wet Prep: NONE SEEN

## 2021-05-19 LAB — POC PREGNANCY UR-QUAL: Preg Test, Ur: NEGATIVE

## 2021-05-19 LAB — LIPASE: Lipase: 58 U/L — ABNORMAL LOW (ref 73–393)

## 2021-05-19 MED ORDER — CEPHALEXIN 500 MG PO CAPS
500 MG | ORAL_CAPSULE | Freq: Two times a day (BID) | ORAL | 0 refills | Status: DC
Start: 2021-05-19 — End: 2021-05-19

## 2021-05-19 MED ORDER — LIDOCAINE HCL 1% INJ (MIXTURES ONLY)
1 % | Freq: Once | INTRAMUSCULAR | Status: AC
Start: 2021-05-19 — End: 2021-05-19
  Administered 2021-05-19: 19:00:00 500 mg via INTRAMUSCULAR

## 2021-05-19 MED ORDER — AZITHROMYCIN 250 MG PO TABS
250 MG | ORAL | Status: AC
Start: 2021-05-19 — End: 2021-05-19
  Administered 2021-05-19: 19:00:00 1000 mg via ORAL

## 2021-05-19 MED ORDER — CEPHALEXIN 500 MG PO CAPS
500 MG | ORAL_CAPSULE | Freq: Two times a day (BID) | ORAL | 0 refills | Status: AC
Start: 2021-05-19 — End: 2021-05-26

## 2021-05-19 MED ORDER — METRONIDAZOLE 500 MG PO TABS
500 MG | ORAL_TABLET | Freq: Two times a day (BID) | ORAL | 0 refills | Status: DC
Start: 2021-05-19 — End: 2021-05-19

## 2021-05-19 MED ORDER — METRONIDAZOLE 0.75 % VA GEL
0.75 % | Freq: Every day | VAGINAL | 0 refills | Status: AC
Start: 2021-05-19 — End: 2021-05-24

## 2021-05-19 MED ORDER — SODIUM CHLORIDE 0.9 % IV BOLUS
0.9 % | Freq: Once | INTRAVENOUS | Status: AC
Start: 2021-05-19 — End: 2021-05-19
  Administered 2021-05-19: 17:00:00 1000 mL via INTRAVENOUS

## 2021-05-19 MED FILL — AZITHROMYCIN 250 MG PO TABS: 250 mg | ORAL | Qty: 4

## 2021-05-19 MED FILL — CEFTRIAXONE SODIUM 500 MG IJ SOLR: 500 mg | INTRAMUSCULAR | Qty: 500

## 2021-05-19 NOTE — ED Triage Notes (Signed)
Pt arrives ambulatory to triage. Pt states LMP about a year ago, has child in march. Reports vaginal bleeding, lower abd pain, and brown vaginal discharge for a few days. NAD. Masked.

## 2021-05-19 NOTE — ED Notes (Signed)
I have reviewed discharge instructions with the patient.  The patient verbalized understanding.    Patient left ED via Discharge Method: ambulatory to Home with self.    Opportunity for questions and clarification provided.       Patient given 2 scripts.         To continue your aftercare when you leave the hospital, you may receive an automated call from our care team to check in on how you are doing.  This is a free service and part of our promise to provide the best care and service to meet your aftercare needs.??? If you have questions, or wish to unsubscribe from this service please call [phone].  Thank you for Choosing our [facility] Emergency Department.       Earmon Phoenix, RN  05/19/21 1524

## 2021-05-19 NOTE — ED Provider Notes (Signed)
[facility] Emergency Department Provider Note                   PCP:                None Provider               Age: 20 y.o.      Sex: female       ICD-10-CM    1. Acute cystitis without hematuria  N30.00       2. Bacterial vaginosis  N76.0     B96.89           DISPOSITION Decision To Discharge 05/19/2021 02:28:39 PM        MDM  Number of Diagnoses or Management Options  Acute cystitis without hematuria  Bacterial vaginosis  Diagnosis management comments: Patient is a 20 year old female who presents with brown vaginal discharge along with dysuria and suprapubic discomfort.  Wanted to ensure that she is not pregnant.  UCG negative.  Has benign abdominal exam.  Found to have UTI.  Also concerned about STDs.  Did not want a do a pelvic exam.  She elected to self swab.  Has clue cells consistent with bacterial vaginosis.  No trichomonas or yeast.  She requests MetroGel in for oral medication as she does not tolerate Flagyl well orally.  She wants to be treated empirically for STDs.  We will go ahead and give shot of Rocephin and Zithromax here in the ER.  Will write for Keflex as well for UTI.  Strict return precautions given.    Risk of Complications, Morbidity, and/or Mortality  Presenting problems: moderate  Diagnostic procedures: moderate  Management options: moderate    Patient Progress  Patient progress: stable       Orders Placed This Encounter   Procedures    Wet prep, genital    C.trachomatis N.gonorrhoeae DNA    Culture, Urine    CBC with Diff    CMP    Lipase    Urinalysis w rflx microscopic    Diet NPO    POCT Urine Dipstick    POC Pregnancy Urine Qual    POCT Urinalysis no Micro    POC Pregnancy Urine Qual    Saline lock IV        Debbie Barnes is a 20 y.o. female who presents to the Emergency Department with chief complaint of    Chief Complaint   Patient presents with    Abdominal Pain      Patient is a 20 year old female who presents with 3 days of suprapubic discomfort, painful urination, brown  vaginal discharge with pelvic cramping.  She had a baby 5 months ago and has not had a menstrual cycle since then.  She wanted to check and see if she was pregnant and see if she had any STDs.  Denies dyspareunia.  No fever or other complaints.        Review of Systems   Constitutional: Negative.    HENT: Negative.     Respiratory: Negative.     Cardiovascular: Negative.    Gastrointestinal: Negative.    Genitourinary:  Positive for dysuria, menstrual problem and vaginal discharge.   All other systems reviewed and are negative.    No past medical history on file.     No past surgical history on file.     No family history on file.     Social History     Socioeconomic History    Marital  status: Single         Patient has no known allergies.     Previous Medications    IBUPROFEN (ADVIL;MOTRIN) 600 MG TABLET    Take 600 mg by mouth every 6 hours as needed        Vitals signs and nursing note reviewed.   Patient Vitals for the past 4 hrs:   Temp Pulse Resp BP SpO2   05/19/21 1244 -- -- -- -- 95 %   05/19/21 1229 -- -- -- 116/80 100 %   05/19/21 1214 -- -- -- 109/68 99 %   05/19/21 1159 -- -- -- 118/73 100 %   05/19/21 1154 -- -- -- 120/75 100 %   05/19/21 1100 98.6 ??F (37 ??C) 75 18 130/82 99 %          Physical Exam  Vitals and nursing note reviewed.   Constitutional:       General: She is not in acute distress.     Appearance: She is well-developed. She is obese. She is not ill-appearing or toxic-appearing.   HENT:      Head: Normocephalic.   Eyes:      Extraocular Movements: Extraocular movements intact.   Cardiovascular:      Rate and Rhythm: Normal rate and regular rhythm.      Pulses: Normal pulses.      Heart sounds: Normal heart sounds.   Pulmonary:      Effort: Pulmonary effort is normal.      Breath sounds: Normal breath sounds.   Abdominal:      General: Bowel sounds are normal.      Palpations: Abdomen is soft.      Tenderness: There is no abdominal tenderness. There is no guarding or rebound.      Comments:  Nontender diffusely, including deep palpation to the lower abdomen.   Genitourinary:     Comments: Patient declined pelvic exam and elected to self swab.  Musculoskeletal:         General: Normal range of motion.      Cervical back: Normal range of motion and neck supple.   Lymphadenopathy:      Cervical: No cervical adenopathy.   Skin:     General: Skin is warm and dry.      Findings: No rash.   Neurological:      General: No focal deficit present.      Mental Status: She is alert. Mental status is at baseline.   Psychiatric:         Mood and Affect: Mood normal.         Behavior: Behavior normal.         Thought Content: Thought content normal.        Procedures      Labs Reviewed   CBC WITH AUTO DIFFERENTIAL - Abnormal; Notable for the following components:       Result Value    Hemoglobin 11.6 (*)     MCHC 31.3 (*)     All other components within normal limits   COMPREHENSIVE METABOLIC PANEL - Abnormal; Notable for the following components:    Chloride 111 (*)     Anion Gap 5 (*)     Glucose 105 (*)     Creatinine 1.20 (*)     Alk Phosphatase 47 (*)     Albumin 3.3 (*)     Globulin 3.9 (*)     Albumin/Globulin Ratio 0.8 (*)     All  other components within normal limits   LIPASE - Abnormal; Notable for the following components:    Lipase 58 (*)     All other components within normal limits   URINALYSIS - Abnormal; Notable for the following components:    Protein, UA 30 (*)     Blood, Urine LARGE (*)     Leukocyte Esterase, Urine LARGE (*)     WBC, UA 20-50 (*)     RBC, UA 20-50 (*)     Epithelial Cells UA 0-5 (*)     BACTERIA, URINE 1+ (*)     Casts 0-2 (*)     All other components within normal limits   POCT URINALYSIS DIPSTICK - Abnormal; Notable for the following components:    Protein, Urine, POC 30 (*)     Blood, UA POC LARGE (*)     Leukocyte Est, UA POC SMALL (*)     All other components within normal limits   WET PREP, GENITAL   C.TRACHOMATIS N.GONORRHOEAE DNA   CULTURE, URINE   POC PREGNANCY UR-QUAL    POC PREGNANCY UR-QUAL        No orders to display                          Voice dictation software was used during the making of this note.  This software is not perfect and grammatical and other typographical errors may be present.  This note has not been completely proofread for errors.        Franne Grip, [place]  05/19/21 1437

## 2021-05-19 NOTE — Discharge Instructions (Addendum)
Call your gynecologist today for follow-up.  Return if worsening in any way.

## 2021-05-21 LAB — C.TRACHOMATIS N.GONORRHOEAE DNA
Chlamydia trachomatis, NAA: POSITIVE — AB
N. Gonorrhoeae RNA: NEGATIVE

## 2021-05-21 LAB — CULTURE, URINE

## 2021-07-19 IMAGING — US US MFM OB DETAIL+14 WK
1 series · 13 of 28 positions shown · non-contrast
Comparison: none

[Series 1: us mfm ob detail+14 wk · 13 of 98 slices shown]
[im 4/98]
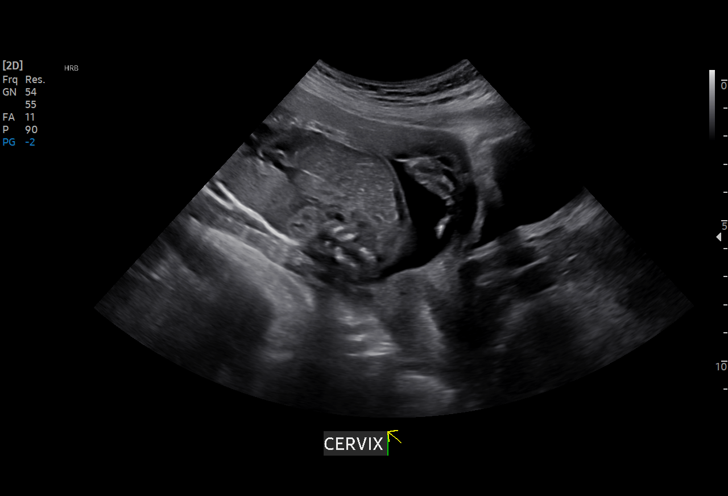
[im 11/98]
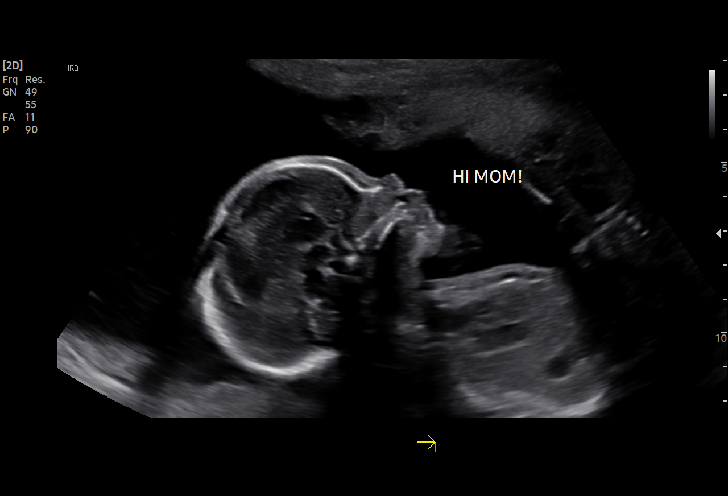
[im 18/98]
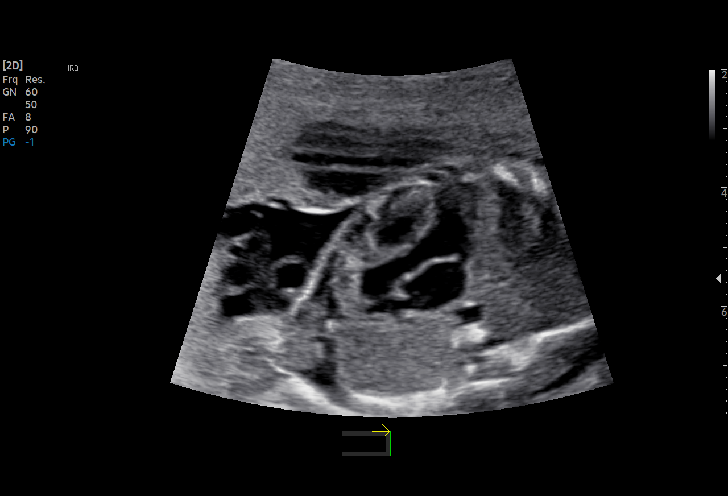
[im 26/98]
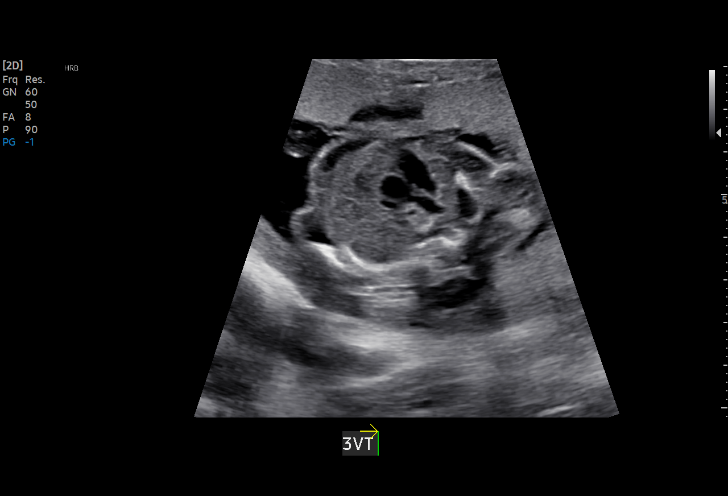
[im 33/98]
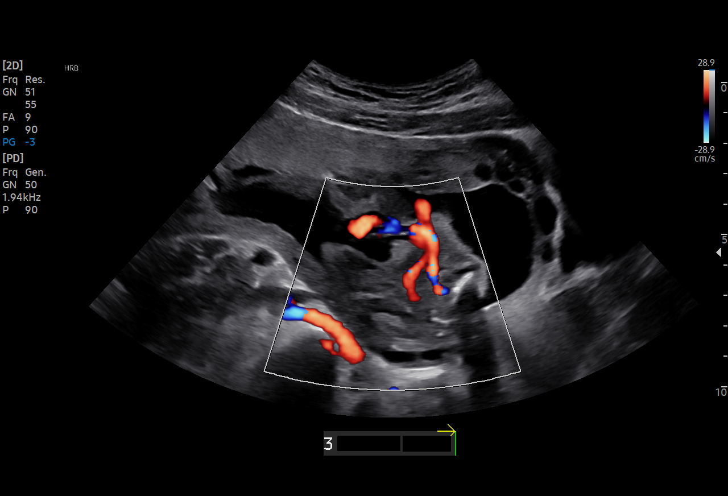
[im 40/98]
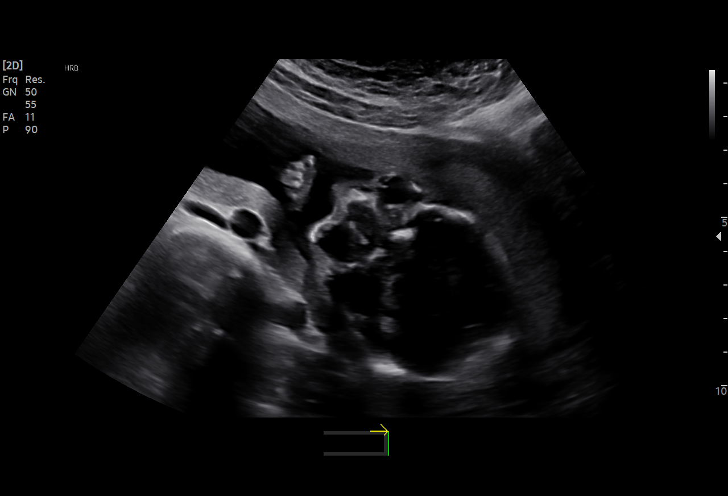
[im 51/98]
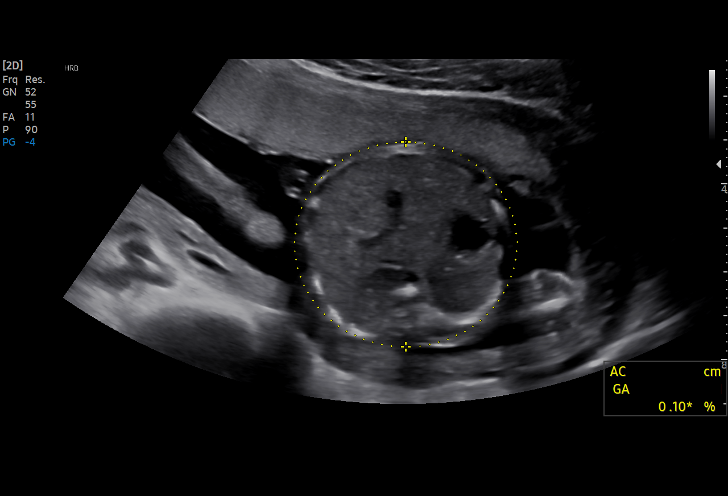
[im 58/98]
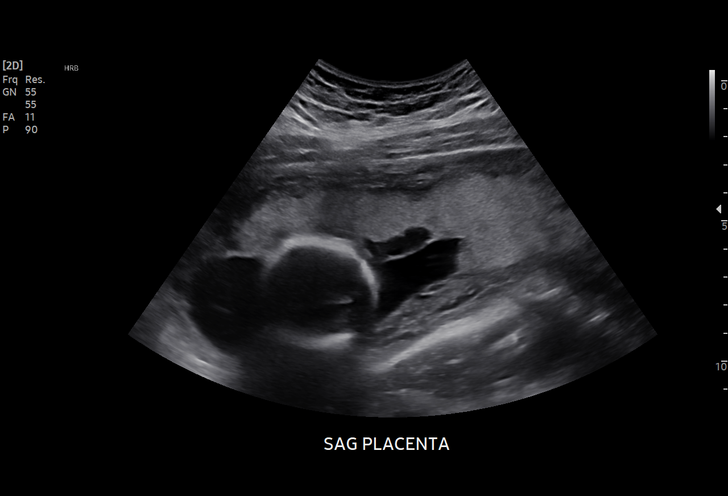
[im 65/98]
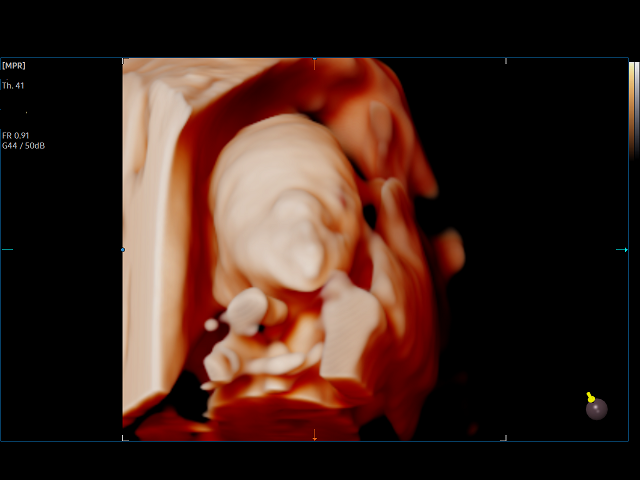
[im 72/98]
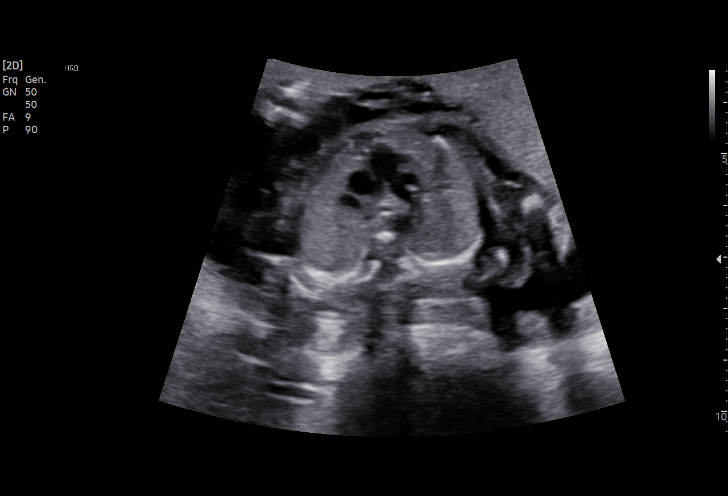
[im 80/98]
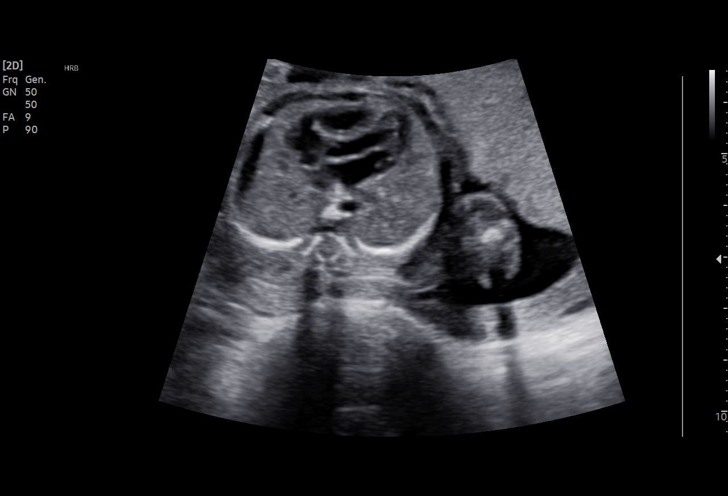
[im 87/98]
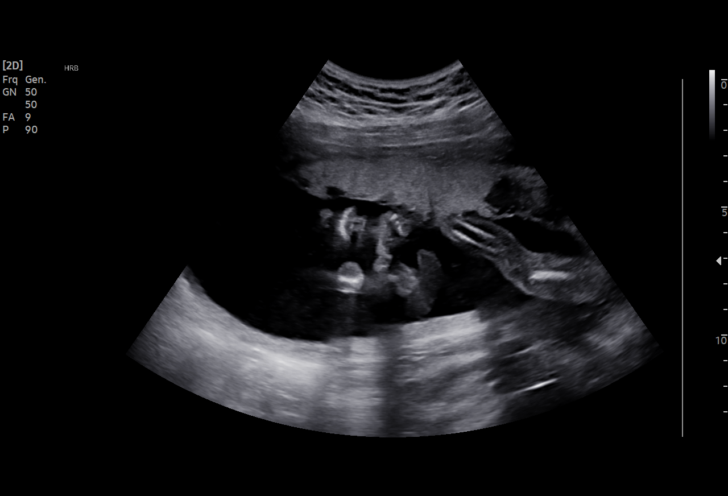
[im 94/98]
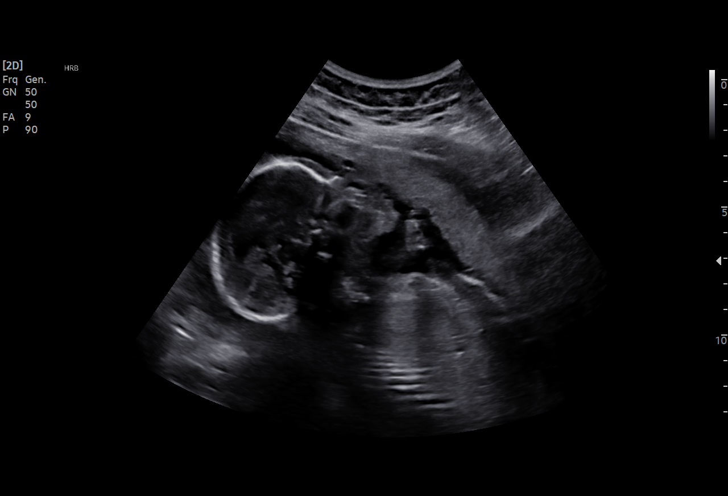

[13 of 28 positions shown; findings below may reference images not displayed]

Indications

 Fetal abnormality - other known or
 suspected (ICEF)
 Encounter for uncertain dates
 Encounter for antenatal screening for
 malformations (low risk NIPS))
 Genetic carrier (increased risk for SMA)
 20 weeks gestation of pregnancy
Vital Signs

 BMI:
Fetal Evaluation

 Num Of Fetuses:         1
 Fetal Heart Rate(bpm):  135
 Cardiac Activity:       Observed
 Presentation:           Breech
 Placenta:               Anterior
 P. Cord Insertion:      Visualized, central

 Amniotic Fluid
 AFI FV:      Within normal limits

                             Largest Pocket(cm)

Biometry
 BPD:      46.2  mm     G. Age:  20w 0d         36  %    CI:        70.93   %    70 - 86
                                                         FL/HC:      19.5   %    16.8 -
 HC:      174.8  mm     G. Age:  20w 0d         29  %    HC/AC:      1.16        1.09 -
 AC:      150.5  mm     G. Age:  20w 2d         44  %    FL/BPD:     73.8   %
 FL:       34.1  mm     G. Age:  20w 5d         58  %    FL/AC:      22.7   %    20 - 24
 CER:      21.8  mm     G. Age:  20w 4d         78  %
 LV:        5.8  mm
 CM:        3.2  mm

 Est. FW:     353  gm    0 lb 12 oz      53  %
OB History

 Gravidity:    2
Gestational Age

 LMP:           24w 3d        Date:  03/03/20                 EDD:   12/08/20
 U/S Today:     20w 2d                                        EDD:   01/06/21
 Best:          20w 2d     Det. By:  U/S (08/21/20)           EDD:   01/06/21
Anatomy

 Cranium:               Appears normal         Aortic Arch:            Not well visualized
 Cavum:                 Appears normal         Ductal Arch:            Appears normal
 Ventricles:            Appears normal         Diaphragm:              Not well visualized
 Choroid Plexus:        Appears normal         Stomach:                Appears normal, left
                                                                       sided
 Cerebellum:            Appears normal         Abdomen:                Appears normal
 Posterior Fossa:       Appears normal         Abdominal Wall:         Appears nml (cord
                                                                       insert, abd wall)
 Nuchal Fold:           Not applicable (>20    Cord Vessels:           Appears normal (3
                        wks GA)                                        vessel cord)
 Face:                  Appears normal         Kidneys:                Appear normal
                        (orbits and profile)
 Lips:                  Appears normal         Bladder:                Appears normal
 Thoracic:              Appears normal         Spine:                  Not well visualized
 Heart:                 Appears normal; EIF    Upper Extremities:      Appears normal
 RVOT:                  Appears normal         Lower Extremities:      Appears normal
 LVOT:                  Appears normal

 Other:  Fetus appears to be a male. Heels and 5th digit visualized. Nasal
         bone visualized. Open hands visualized.  3VV and 3VTV visualized.
         Technically difficult due to fetal position.
Cervix Uterus Adnexa

 Cervix
 Length:           3.75  cm.
 Normal appearance by transabdominal scan.
Comments

 This patient was seen for a detailed fetal anatomy scan.  She
 has screened positive as a carrier for spinal muscular atrophy.
 She denies any significant past medical history and denies
 any problems in her current pregnancy.
 She had a cell free DNA test earlier in her pregnancy which
 indicated a low risk for trisomy 21, 18, and 13. A male fetus is
 predicted.
 Based on the fetal biometry measurements obtained today,
 her EDC was changed to January 06, 2021.
 On today's exam, an intracardiac echogenic focus was noted
 in the left ventricle of the fetal heart.  The small association
 between an echogenic focus and Down syndrome was
 discussed. Due to the echogenic focus noted today, the
 patient was offered and declined an amniocentesis today for
 definitive diagnosis of fetal aneuploidy.  She reports that she
 is comfortable with her negative cell free DNA test.
 The views of the fetal anatomy were limited today due to the
 fetal position.
 The patient was informed that anomalies may be missed due
 to technical limitations. If the fetus is in a suboptimal position
 or maternal habitus is increased, visualization of the fetus in
 the maternal uterus may be impaired.
 A follow-up exam was scheduled in 4 weeks to confirm her
 dates and to complete the views of the fetal anatomy.

## 2021-08-17 ENCOUNTER — Ambulatory Visit: Payer: Medicaid Other

## 2021-10-08 ENCOUNTER — Ambulatory Visit (INDEPENDENT_AMBULATORY_CARE_PROVIDER_SITE_OTHER): Payer: Medicaid Other

## 2021-10-08 ENCOUNTER — Other Ambulatory Visit: Payer: Self-pay

## 2021-10-08 ENCOUNTER — Other Ambulatory Visit (HOSPITAL_COMMUNITY)
Admission: RE | Admit: 2021-10-08 | Discharge: 2021-10-08 | Disposition: A | Discharge status: Home / Self Care | Payer: Medicaid Other | Source: Ambulatory Visit | Attending: Obstetrics & Gynecology | Admitting: Obstetrics & Gynecology

## 2021-10-08 VITALS — BP 120/77 | HR 89 | Ht 66.0 in | Wt 189.0 lb

## 2021-10-08 DIAGNOSIS — N926 Irregular menstruation, unspecified: Secondary | ICD-10-CM

## 2021-10-08 DIAGNOSIS — N898 Other specified noninflammatory disorders of vagina: Secondary | ICD-10-CM | POA: Diagnosis present

## 2021-10-08 LAB — POCT URINE PREGNANCY: Preg Test, Ur: NEGATIVE

## 2021-10-08 NOTE — Progress Notes (Signed)
SUBJECTIVE:  20 y.o. female complains of brown, malodorous vaginal discharge for 2-3 day(s). Denies abnormal vaginal bleeding or significant pelvic pain or fever. No UTI symptoms. Denies history of known exposure to STD.  Patient's last menstrual period was 08/05/2021 (approximate).  OBJECTIVE:  She appears well, afebrile. Urine dipstick: not done.   ASSESSMENT:  Vaginal Discharge  Vaginal Odor Late menses    PLAN:  GC, chlamydia, trichomonas, BVAG, CVAG probe sent to lab. UPT obtained due to late cycle.  Treatment: To be determined once lab results are received ROV prn if symptoms persist or worsen.

## 2021-10-09 LAB — CERVICOVAGINAL ANCILLARY ONLY
Bacterial Vaginitis (gardnerella): NEGATIVE
Candida Glabrata: NEGATIVE
Candida Vaginitis: NEGATIVE
Chlamydia: NEGATIVE
Comment: NEGATIVE
Comment: NEGATIVE
Comment: NEGATIVE
Comment: NEGATIVE
Comment: NEGATIVE
Comment: NORMAL
Neisseria Gonorrhea: NEGATIVE
Trichomonas: POSITIVE — AB

## 2021-10-12 MED ORDER — METRONIDAZOLE 500 MG PO TABS: ORAL_TABLET | ORAL | 0 refills | Status: DC

## 2021-10-12 NOTE — Addendum Note (Signed)
Addended by: Adam Phenix on: 10/12/2021 05:29 PM   Modules accepted: Orders

## 2021-10-15 ENCOUNTER — Other Ambulatory Visit: Payer: Self-pay | Admitting: Obstetrics & Gynecology

## 2021-10-15 MED ORDER — FLUCONAZOLE 150 MG PO TABS: 150.0000 mg | ORAL_TABLET | Freq: Once | ORAL | 0 refills | Status: AC

## 2021-10-15 NOTE — Progress Notes (Signed)
Diflucan requested by patient following Flagyl treatment.  Rx routed per protocol.

## 2021-10-23 ENCOUNTER — Telehealth: Payer: Self-pay

## 2021-10-23 NOTE — Telephone Encounter (Addendum)
LVM to follow up with pt possible "abscess?"

## 2021-10-31 ENCOUNTER — Encounter (HOSPITAL_COMMUNITY): Payer: Self-pay | Admitting: Emergency Medicine

## 2021-10-31 ENCOUNTER — Other Ambulatory Visit: Payer: Self-pay

## 2021-10-31 ENCOUNTER — Emergency Department (HOSPITAL_COMMUNITY)
Admission: EM | Admit: 2021-10-31 | Discharge: 2021-10-31 | Disposition: A | Discharge status: Home / Self Care | Payer: Medicaid Other | Attending: Emergency Medicine | Admitting: Emergency Medicine

## 2021-10-31 DIAGNOSIS — L0591 Pilonidal cyst without abscess: Secondary | ICD-10-CM | POA: Insufficient documentation

## 2021-10-31 MED ORDER — DOXYCYCLINE HYCLATE 100 MG PO TABS
100.0000 mg | ORAL_TABLET | Freq: Once | ORAL | Status: AC
Start: 2021-10-31 — End: 2021-10-31
  Administered 2021-10-31: 100 mg via ORAL
  Filled 2021-10-31: qty 1

## 2021-10-31 MED ORDER — BUPIVACAINE HCL (PF) 0.5 % IJ SOLN
10.0000 mL | Freq: Once | INTRAMUSCULAR | Status: DC
  Filled 2021-10-31: qty 10

## 2021-10-31 MED ORDER — DOXYCYCLINE HYCLATE 100 MG PO CAPS: 100.0000 mg | ORAL_CAPSULE | Freq: Two times a day (BID) | ORAL | 0 refills | Status: DC

## 2021-10-31 MED ORDER — LIDOCAINE-EPINEPHRINE (PF) 2 %-1:200000 IJ SOLN
10.0000 mL | Freq: Once | INTRAMUSCULAR | Status: DC
  Filled 2021-10-31: qty 20

## 2021-10-31 NOTE — ED Triage Notes (Signed)
Pt reports pilonidal abscess x 1 week.  History of same.

## 2021-10-31 NOTE — ED Provider Notes (Signed)
[facility] EMERGENCY DEPARTMENT Provider Note   CSN: [id number] Arrival date & time: 10/31/21  1241     History  Chief Complaint  Patient presents with   Abscess    Veronica Quinn is a 21 y.o. 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 She states that over the past week she has had worsening pain in the area.  She denies any fevers and otherwise feels well. No drainage from the area.  She has a doctor's appointment this coming week for this, however given that it was not resolving she elected to come in today.  HPI     Home Medications Prior to Admission medications   Medication Sig Start Date End Date Taking? Authorizing Provider  doxycycline (VIBRAMYCIN) 100 MG capsule Take 1 capsule (100 mg total) by mouth 2 (two) times daily for 7 days. 10/31/21 11/07/21 Yes Cristina Gong, PA-C  metroNIDAZOLE (FLAGYL) 500 MG tablet Take two tablets by mouth twice a day, for one day.  Or you can take all four tablets at once if you can tolerate it. 10/12/21   Adam Phenix, MD      Allergies    Patient has no known allergies.    Review of Systems   Review of Systems  Constitutional:  Negative for chills and fever.  Genitourinary:        Pain in gluteal cleft  All other systems reviewed and are negative.  Physical Exam Updated Vital Signs BP 134/64    Pulse (!) 58    Temp 98.6 F (37 C) (Oral)    Resp 16    SpO2 100%  Physical Exam Vitals and nursing note reviewed.  Constitutional:      General: She is not in acute distress.    Appearance: She is not ill-appearing.  HENT:     Head: Normocephalic.  Cardiovascular:     Rate and Rhythm: Normal rate.  Pulmonary:     Effort: Pulmonary effort is normal. No respiratory distress.  Genitourinary:    Comments: GU exam limited to the superior gluteal cleft: External genitalia were not exposed or  examined. The superior gluteal cleft has 2 approximately 1 cm scars from prior I&D's consistent with report. There is under 1 cm sized area of firmness deep under the skin.  There is no skin discoloration, induration, fluctuance or drainage. Neurological:     Mental Status: She is alert.    ED Results / Procedures / Treatments   Labs (all labs ordered are listed, but only abnormal results are displayed) Labs Reviewed - No data to display  EKG None  Radiology No results found.  Procedures Ultrasound ED Soft Tissue  Date/Time: 10/31/2021 4:46 PM Performed by: Cristina Gong, PA-C Authorized by: Cristina Gong, PA-C   Procedure details:    Indications: localization of abscess     Transverse view:  Visualized   Longitudinal view:  Visualized   Images: archived   Location:    Location: buttocks     : Midline, at the superiormost aspect of the gluteal cleft. Comments:     There is a subcentimeter structure identified, this area is not very compressible, with what appears to be both fluid and solid material.    Medications Ordered in ED Medications  doxycycline (VIBRA-TABS) tablet 100 mg (100 mg Oral Given 10/31/21 1646)    ED Course/ Medical Decision Making/ A&P  Medical Decision Making Patient is a 21 year old woman who presents today for evaluation of approximately 1 week of pain in the gluteal cleft where she has previously had pilonidal abscesses. On clinical exam there is a approximately centimeter palpable area of firmness without skin changes such as induration, erythema.  This area is slightly tender however patient tolerates palpation of the area quite well. Ultrasound was used showing that she does have a small structure in this area, however it is at maximum 1 cm.    We had an extensive discussion of treatment options. Based on the small size, and the lack of skin changes including fluctuance, erythema, and her prior  incisions in the area we discussed that I am not certain I can get a significant drainage out of this cyst in question if it is irritated or actually infected.  Patient has a follow-up appointment with her doctor this week.  Given the small size of it it does not clearly require I&D at this time.  Through shared decision making patient elected for trial of p.o. antibiotics. She is aware that she may require I&D at a future point and states her understanding of this. Additionally given that this is a recurrent issue for her I recommended she follow-up with general surgery for consideration of excision of the area to prevent future episodes.  She denies possibility of pregnancy.   Return precautions were discussed with patient who states their understanding.  At the time of discharge patient denied any unaddressed complaints or concerns.  Patient is agreeable for discharge home.  Note: Portions of this report may have been transcribed using voice recognition software. Every effort was made to ensure accuracy; however, inadvertent computerized transcription errors may be present  Final Clinical Impression(s) / ED Diagnoses Final diagnoses:  Pilonidal cyst without abscess    Rx / DC Orders ED Discharge Orders          Ordered    doxycycline (VIBRAMYCIN) 100 MG capsule  2 times daily        10/31/21 1641              Cristina Gong, Cordelia Poche 10/31/21 1652    Linwood Dibbles, MD 11/01/21 1511

## 2021-10-31 NOTE — Discharge Instructions (Addendum)
Today we discussed option for attempted drainage versus trial of treatment with antibiotics.  Through shared decision-making elected for trial of antibiotics. If you develop fevers, the area becomes markedly swollen, or you have any new or concerning symptoms please seek additional medical care and evaluation. Please use warm compresses on the area.  Please take Ibuprofen (Advil, motrin) and Tylenol (acetaminophen) to relieve your pain.    You may take up to 600 MG (3 pills) of normal strength ibuprofen every 8 hours as needed.   You make take tylenol, up to 1,000 mg (two extra strength pills) every 8 hours as needed.   It is safe to take ibuprofen and tylenol at the same time as they work differently.   Do not take more than 3,000 mg tylenol in a 24 hour period (not more than one dose every 8 hours.  Please check all medication labels as many medications such as pain and cold medications may contain tylenol.  Do not drink alcohol while taking these medications.  Do not take other NSAID'S while taking ibuprofen (such as aleve or naproxen).  Please take ibuprofen with food to decrease stomach upset.  You may have diarrhea from the antibiotics.  It is very important that you continue to take the antibiotics even if you get diarrhea unless a medical professional tells you that you may stop taking them.  If you stop too early the bacteria you are being treated for will become stronger and you may need different, more powerful antibiotics that have more side effects and worsening diarrhea.  Please stay well hydrated and consider probiotics as they may decrease the severity of your diarrhea.  Please be aware that if you take any hormonal contraception (birth control pills, nexplanon, the ring, etc) that your birth control will not work while you are taking antibiotics and you need to use back up protection as directed on the birth control medication information insert.

## 2021-11-03 ENCOUNTER — Ambulatory Visit: Payer: Medicaid Other | Admitting: Obstetrics

## 2021-11-06 ENCOUNTER — Encounter (HOSPITAL_COMMUNITY): Payer: Self-pay | Admitting: Emergency Medicine

## 2021-11-06 ENCOUNTER — Other Ambulatory Visit: Payer: Self-pay

## 2021-11-06 ENCOUNTER — Emergency Department (HOSPITAL_COMMUNITY)
Admission: EM | Admit: 2021-11-06 | Discharge: 2021-11-06 | Disposition: A | Discharge status: Home / Self Care | Payer: Medicaid Other | Attending: Emergency Medicine | Admitting: Emergency Medicine

## 2021-11-06 DIAGNOSIS — L0501 Pilonidal cyst with abscess: Secondary | ICD-10-CM | POA: Insufficient documentation

## 2021-11-06 MED ORDER — SULFAMETHOXAZOLE-TRIMETHOPRIM 800-160 MG PO TABS: 1.0000 | ORAL_TABLET | Freq: Two times a day (BID) | ORAL | 0 refills | Status: AC

## 2021-11-06 MED ORDER — LIDOCAINE-EPINEPHRINE (PF) 2 %-1:200000 IJ SOLN
10.0000 mL | Freq: Once | INTRAMUSCULAR | Status: AC
  Administered 2021-11-06: 10 mL
  Filled 2021-11-06: qty 20

## 2021-11-06 NOTE — ED Triage Notes (Signed)
Patient here for re-revaluation of pilonidal cyst, was prescribed antibiotics approximately one week ago, reports no improvement in cyst. Patient alert, oriented, and in no apparent distress at this time.

## 2021-11-06 NOTE — ED Provider Notes (Signed)
Veronica Quinn [facility] EMERGENCY DEPARTMENT Provider Note   CSN: [id number] Arrival date & time: 11/06/21  1032     History  Chief Complaint  Patient presents with   Abscess    Veronica Quinn is a 21 y.o. female who presents to the emergency department for worsening skin abscess.  Patient was seen on 1/14 for pilonidal cyst concerning for an abscess.  An ultrasound was done at that time, which showed no obvious area requiring incision or drainage.  She was discharged on a 7-day course of doxycycline.  She has taken about 4 days of the doxycycline, and presents today stating that the area is more tender and more swollen than it was before. She has had several abscesses requiring drainage before.    Abscess Associated symptoms: no fever      Past Medical History:  Diagnosis Date   Anxiety    Depression    Headache      Home Medications Prior to Admission medications   Medication Sig Start Date End Date Taking? Authorizing Provider  ibuprofen (ADVIL) 200 MG tablet Take 200 mg by mouth every 6 (six) hours as needed for moderate pain, mild pain, headache or fever.   Yes [provider]  sulfamethoxazole-trimethoprim (BACTRIM DS) 800-160 MG tablet Take 1 tablet by mouth 2 (two) times daily for 7 days. 11/06/21 11/13/21 Yes Bernadetta Roell T, PA-C      Allergies    Patient has no known allergies.    Review of Systems   Review of Systems  Constitutional:  Negative for chills and fever.  Gastrointestinal:  Negative for anal bleeding, blood in stool, constipation, diarrhea and rectal pain.  Skin:        Abscess  All other systems reviewed and are negative.  Physical Exam Updated Vital Signs BP 120/67    Pulse 83    Temp 98 F (36.7 C) (Oral)    Resp (!) 23    SpO2 100%  Physical Exam Vitals and nursing note reviewed. Exam conducted with a chaperone present.  Constitutional:      Appearance: Normal appearance.  HENT:     Head: Normocephalic and atraumatic.   Eyes:     Conjunctiva/sclera: Conjunctivae normal.  Pulmonary:     Effort: Pulmonary effort is normal. No respiratory distress.  Skin:    General: Skin is warm and dry.     Comments: Pilonidal abscess with about 7 cm of induration and 1 cm of fluctuance of the right gluteal cleft. Minimal overlying erythema.   Neurological:     Mental Status: She is alert.  Psychiatric:        Mood and Affect: Mood normal.        Behavior: Behavior normal.    ED Results / Procedures / Treatments   Labs (all labs ordered are listed, but only abnormal results are displayed) Labs Reviewed - No data to display  EKG None  Radiology No results found.  Procedures Ultrasound ED Soft Tissue  Date/Time: 11/06/2021 2:02 PM Performed by: Su Monks, PA-C Authorized by: Su Monks, PA-C   Procedure details:    Indications: localization of abscess     Transverse view:  Visualized   Longitudinal view:  Visualized   Images: archived   Location:    Location: buttocks     Side:  Right Findings:     abscess present    cellulitis present .Marland KitchenIncision and Drainage  Date/Time: 11/06/2021 2:17 PM Performed by: Su Monks, PA-C Authorized  by: Su Monks, PA-C   Consent:    Consent obtained:  Verbal   Consent given by:  Patient   Risks, benefits, and alternatives were discussed: yes     Risks discussed:  Incomplete drainage and pain Universal protocol:    Imaging studies available: yes     Patient identity confirmed:  Hospital-assigned identification number Location:    Type:  Abscess   Size:  7 cm   Location:  Anogenital   Anogenital location:  Pilonidal Pre-procedure details:    Skin preparation:  Chlorhexidine with alcohol Sedation:    Sedation type:  None Anesthesia:    Anesthesia method:  Local infiltration   Local anesthetic:  Lidocaine 2% WITH epi Procedure type:    Complexity:  Complex Procedure details:    Ultrasound guidance: yes     Needle  aspiration: yes     Incision types:  Single straight   Incision depth:  Subcutaneous   Wound management:  Probed and deloculated, irrigated with saline and extensive cleaning   Drainage:  Bloody and purulent   Drainage amount:  Copious   Wound treatment:  Wound left open   Packing materials:  None Post-procedure details:    Procedure completion:  Tolerated    Medications Ordered in ED Medications  lidocaine-EPINEPHrine (XYLOCAINE Quinn/EPI) 2 %-1:200000 (PF) injection 10 mL (10 mLs Infiltration Given by Other 11/06/21 1444)    ED Course/ Medical Decision Making/ A&P                           Medical Decision Making Risk Prescription drug management.   Patient is an otherwise healthy 21 year old female who presents to the emergency department for worsening skin abscess.  Patient was seen on 1/14 for pilonidal cyst concerning for an abscess, no incision was performed at that time and she was discharged on antibiotics.   On my exam patient has an approximately 7 cm area of induration on the right just lateral to the gluteal cleft, with questionable area of fluctuance of about 1 cm. Ultrasound performed which showed large fluid collection concerning for abscess with associated soft tissue swelling. Incision and drainage performed at the bedside and patient tolerated procedure well. I was able to suction copious purulent fluid after complex deloculation. Wound was left open to drain.   Patient is not requiring admission or inpatient treatment for her symptoms. She has no co-morbidities or social determinants of health that should affect her recovery. Will change her previous Doxycyline to Bactrim. She scheduled a wound check with her PCP on Monday. Given close return precautions. She is agreeable to the plan.   Final Clinical Impression(s) / ED Diagnoses Final diagnoses:  Pilonidal abscess    Rx / DC Orders ED Discharge Orders          Ordered    sulfamethoxazole-trimethoprim (BACTRIM  DS) 800-160 MG tablet  2 times daily        11/06/21 1513           Portions of this report may have been transcribed using voice recognition software. Every effort was made to ensure accuracy; however, inadvertent computerized transcription errors may be present.    Su Monks, PA-C 11/06/21 1522    Melene Plan, DO 11/06/21 1545

## 2021-11-06 NOTE — ED Provider Triage Note (Signed)
Emergency Medicine Provider Triage Evaluation Note  Veronica Quinn , a 21 y.o. female  was evaluated in triage.  Pt complains of sacral abscess.  Patient states that she has had an abscess for about 3 weeks.  She was recently seen in the emergency department, and was told that it was too small to incise and drain.  She was placed on doxycycline, she has not finished this yet.  She states her pain is worsening and the area appears more swollen.  She also notes feeling increasingly hot, has not checked her temperature.  Review of Systems  Positive: Abscess Negative: Fever, chills, blood in stool, change in bowel habits  Physical Exam  BP (!) 145/90 (BP Location: Left Arm)    Pulse (!) 106    Temp 98 F (36.7 C) (Oral)    Resp 19    SpO2 99%  Gen:   Awake, no distress   Resp:  Normal effort  MSK:   Moves extremities without difficulty  Other:    Medical Decision Making  Medically screening exam initiated at 10:40 AM.  Appropriate orders placed.  Leyda Vanderwerf was informed that the remainder of the evaluation will be completed by another provider, this initial triage assessment does not replace that evaluation, and the importance of remaining in the ED until their evaluation is complete.     Jacqualine Weichel T, PA-C 11/06/21 1040

## 2021-11-06 NOTE — Discharge Instructions (Addendum)
You were seen in the emergency department for an abscess.   I am changing your antibiotic from doxycyline to a medicine called bactrim, which you will take twice daily for 7 days.  Please follow up with your PCP in 2-3 days for a skin check. As we discussed you can wash the area with warm soapy water, but don't sit in water like in the bathtub. I've attached some other instructions for how to care for the area.   Please use Tylenol or ibuprofen for pain.  You may use 600 mg ibuprofen every 6 hours or 1000 mg of Tylenol every 6 hours.  You may choose to alternate between the 2.  This would be most effective.  Do not exceed 4 g of Tylenol within 24 hours.  Do not exceed 3200 mg ibuprofen within 24 hours.  Continue to monitor how you're doing and return to the ER for new or worsening symptoms such as fevers, chills, changes in bowel movements, etc.

## 2021-11-06 NOTE — ED Notes (Signed)
Pt verbalized understanding of d/c instructions, meds, and followup care. Denies questions. VSS, no distress noted. Steady gait to exit with all belongings.  ?

## 2021-11-17 ENCOUNTER — Ambulatory Visit: Payer: Medicaid Other | Admitting: Obstetrics & Gynecology

## 2021-12-23 ENCOUNTER — Ambulatory Visit: Payer: Medicaid Other

## 2022-02-03 ENCOUNTER — Ambulatory Visit (INDEPENDENT_AMBULATORY_CARE_PROVIDER_SITE_OTHER): Payer: Medicaid Other

## 2022-02-03 ENCOUNTER — Other Ambulatory Visit (HOSPITAL_COMMUNITY)
Admission: RE | Admit: 2022-02-03 | Discharge: 2022-02-03 | Disposition: A | Discharge status: Home / Self Care | Payer: Medicaid Other | Source: Ambulatory Visit | Attending: Obstetrics and Gynecology | Admitting: Obstetrics and Gynecology

## 2022-02-03 DIAGNOSIS — N898 Other specified noninflammatory disorders of vagina: Secondary | ICD-10-CM | POA: Diagnosis present

## 2022-02-03 DIAGNOSIS — N926 Irregular menstruation, unspecified: Secondary | ICD-10-CM | POA: Diagnosis not present

## 2022-02-03 LAB — POCT URINE PREGNANCY: Preg Test, Ur: NEGATIVE

## 2022-02-03 NOTE — Progress Notes (Signed)
Agree with nurses's documentation of this patient's clinic encounter.  Veronica Quinn L, MD  

## 2022-02-03 NOTE — Progress Notes (Signed)
Pt is in the office reporting vaginal discharge and odor. Pt will perform self swab for std testing today. Pt also states that on 01/21/22 she had some light bleeding that only lasted 3 days which is unlike her normal cycle. Pt requesting UPT today. ?UPT is negative in office today ?

## 2022-02-04 LAB — CERVICOVAGINAL ANCILLARY ONLY
Bacterial Vaginitis (gardnerella): POSITIVE — AB
Candida Glabrata: NEGATIVE
Candida Vaginitis: NEGATIVE
Chlamydia: NEGATIVE
Comment: NEGATIVE
Comment: NEGATIVE
Comment: NEGATIVE
Comment: NEGATIVE
Comment: NEGATIVE
Comment: NORMAL
Neisseria Gonorrhea: NEGATIVE
Trichomonas: NEGATIVE

## 2022-02-05 ENCOUNTER — Other Ambulatory Visit: Payer: Self-pay | Admitting: Emergency Medicine

## 2022-02-05 ENCOUNTER — Encounter: Payer: Self-pay | Admitting: Emergency Medicine

## 2022-02-05 MED ORDER — METRONIDAZOLE 500 MG PO TABS: 500.0000 mg | ORAL_TABLET | Freq: Two times a day (BID) | ORAL | 0 refills | Status: AC

## 2022-02-05 NOTE — Progress Notes (Signed)
Rx sent to pharmacy per protocol. Mychart message sent.  ?

## 2022-02-16 ENCOUNTER — Other Ambulatory Visit: Payer: Self-pay | Admitting: *Deleted

## 2022-02-16 MED ORDER — METRONIDAZOLE 0.75 % VA GEL: 1.0000 | Freq: Every day | VAGINAL | 0 refills | Status: AC

## 2022-02-16 NOTE — Progress Notes (Signed)
Pt request Metrogel instead of using oral Flagyl. ?Metrogel ordered today.  ?

## 2022-04-22 ENCOUNTER — Inpatient Hospital Stay
Admit: 2022-04-22 | Discharge: 2022-04-22 | Disposition: A | Discharge status: Home / Self Care | Payer: BLUE CROSS/BLUE SHIELD | Attending: Emergency Medicine

## 2022-04-22 DIAGNOSIS — L0501 Pilonidal cyst with abscess: Secondary | ICD-10-CM

## 2022-04-22 MED ORDER — HYDROMORPHONE HCL PF 1 MG/ML IJ SOLN
1 MG/ML | INTRAMUSCULAR | Status: AC
Start: 2022-04-22 — End: 2022-04-22
  Administered 2022-04-22: 20:00:00 1 mg via INTRAVENOUS

## 2022-04-22 MED ORDER — LIDO-EPINEPHRINE-TETRACAINE 4-0.05-0.5 % EX SOLN
Freq: Once | CUTANEOUS | Status: AC
Start: 2022-04-22 — End: 2022-04-22
  Administered 2022-04-22: 20:00:00 3 via TOPICAL

## 2022-04-22 MED ORDER — LIDO-EPINEPHRINE-TETRACAINE 4-0.05-0.5 % EX SOLN
CUTANEOUS | Status: AC
Start: 2022-04-22 — End: 2022-04-22
  Administered 2022-04-22: 20:00:00 3 via TOPICAL

## 2022-04-22 MED ORDER — LIDOCAINE HCL 1 % IJ SOLN
1 % | INTRAMUSCULAR | Status: AC
Start: 2022-04-22 — End: 2022-04-22
  Administered 2022-04-22: 20:00:00 5 mL via INTRADERMAL

## 2022-04-22 MED ORDER — ONDANSETRON HCL 4 MG/2ML IJ SOLN
4 MG/2ML | INTRAMUSCULAR | Status: AC
Start: 2022-04-22 — End: 2022-04-22
  Administered 2022-04-22: 20:00:00 4 mg via INTRAVENOUS

## 2022-04-22 MED ORDER — HYDROCODONE-ACETAMINOPHEN 7.5-325 MG PO TABS
7.5-325 MG | ORAL_TABLET | Freq: Three times a day (TID) | ORAL | 0 refills | Status: AC | PRN
Start: 2022-04-22 — End: 2022-04-25

## 2022-04-22 MED FILL — LIDO-EPINEPHRINE-TETRACAINE 4-0.05-0.5 % EX SOLN: 4-0.05-0.5 % | CUTANEOUS | Qty: 2

## 2022-04-22 MED FILL — LIDO-EPINEPHRINE-TETRACAINE 4-0.05-0.5 % EX SOLN: 4-0.05-0.5 % | CUTANEOUS | Qty: 1

## 2022-04-22 MED FILL — ONDANSETRON HCL 4 MG/2ML IJ SOLN: 4 MG/2ML | INTRAMUSCULAR | Qty: 2

## 2022-04-22 MED FILL — HYDROMORPHONE HCL 1 MG/ML IJ SOLN: 1 mg/mL | INTRAMUSCULAR | Qty: 1

## 2022-04-22 MED FILL — LIDOCAINE HCL 1 % IJ SOLN: 1 % | INTRAMUSCULAR | Qty: 10

## 2022-04-22 NOTE — ED Provider Notes (Signed)
Emergency Department Provider Note       PCP: On File Not (Inactive)   Age: 21 y.o.   Sex: female     DISPOSITION         ICD-10-CM    1. Pilonidal abscess  L05.01           Medical Decision Making     Complexity of Problems Addressed:  Complexity of Problem: 1 chronic illness with exacerbation.    Data Reviewed and Analyzed:  Category 1:   I independently ordered and reviewed each unique test.  I reviewed external records: ED visit note from an outside group.   The patients assessment required an independent historian: Mother.  The reason they were needed is important historical information not provided by the patient.    Category 2:       Category 3: Discussion of management or test interpretation.  21 year old female presents to the emergency department with pain and swelling to her pilonidal region.  States she has a history of cyst that has required I&D in the past.  Patient is screaming at the time of my exam.  Will give pain medication.  She states she just completed her menstrual cycle and there is no chance that she is pregnant.  We have Dilaudid and Zofran.  Bedside ultrasound does show large abscess pocket present.  She denies fever or chills.  Patient has been on Keflex and Bactrim for the past few days which was prescribed by [facility] emergency department.  I&D performed, see procedure note for details, copious amounts of purulent and bloody drainage removed.  Patient will be discharged home given prescription for Norco.  She will continue taking Keflex and Bactrim as previously prescribed.  Given outpatient surgery follow-up and strict turn precautions.  Patient and family voiced understanding and agreement.       Risk of Complications and/or Morbidity of Patient Management:    History     Debbie Barnes is a 21 y.o. female who presents to the Emergency Department with chief complaint of    Chief Complaint   Patient presents with    Cyst      21 year old female presents to the emergency department  with Tylenol cyst and abscess.  Symptoms began a few days ago.  States she had a cyst there for quite some time recently as gotten more painful red and more swollen.  Recently was seen at local emergency department where she was placed on Keflex.  Has been compliant but she reports worsening symptoms.  No fever or chills.  No nausea or vomiting.  States she just completed her menstrual cycle 1 week ago.       Physical Exam     Vitals signs and nursing note reviewed.   Vitals:    04/22/22 1500   BP: (!) 148/133   Pulse: (!) 112   Resp: 24   Temp: 98.1 F (36.7 C)   TempSrc: Axillary   SpO2: 98%   Weight: 180 lb (81.6 kg)   Height: 5\' 6"  (1.676 m)       Physical Exam  Vitals and nursing note reviewed.   Constitutional:       General: She is in acute distress.      Appearance: Normal appearance.      Comments: Patient is screaming at the time of my exam   HENT:      Head: Normocephalic.      Nose: Nose normal.      Mouth/Throat:  Mouth: Mucous membranes are moist.   Eyes:      Extraocular Movements: Extraocular movements intact.   Cardiovascular:      Rate and Rhythm: Regular rhythm. Tachycardia present.      Pulses: Normal pulses.      Heart sounds: Normal heart sounds.   Pulmonary:      Effort: Pulmonary effort is normal. No respiratory distress.   Abdominal:      General: Abdomen is flat.   Musculoskeletal:         General: No swelling or tenderness. Normal range of motion.      Cervical back: Normal range of motion. No rigidity.        Back:       Comments: Large erythematous tender swollen region in the pilonidal region consistent with pilonidal abscess approximately 6 cm in diameter.   Skin:     General: Skin is warm.      Findings: No rash.   Neurological:      General: No focal deficit present.      Mental Status: She is alert and oriented to person, place, and time.   Psychiatric:         Mood and Affect: Mood normal.        Procedures     Incision/Drainage    Date/Time: 04/22/2022 7:01 PM  Performed by:  Eliott Nine, DO  Authorized by: Eliott Nine, DO     Consent:     Consent obtained:  Verbal    Consent given by:  Patient    Risks, benefits, and alternatives were discussed: yes      Risks discussed:  Incomplete drainage, pain, infection and bleeding    Alternatives discussed:  No treatment  Universal protocol:     Procedure explained and questions answered to patient or proxy's satisfaction: yes      Patient identity confirmed:  Verbally with patient  Location:     Type:  Pilonidal cyst (Pilonidal abscess)    Size:  6cm    Location:  Lower extremity (Low back)    Lower extremity location:  Buttock  Pre-procedure details:     Skin preparation:  Antiseptic wash  Sedation:     Sedation type:  None  Anesthesia:     Anesthesia method:  Topical application and local infiltration    Topical anesthetic:  LET    Local anesthetic:  Lidocaine 1% w/o epi  Procedure type:     Complexity:  Complex  Procedure details:     Ultrasound guidance: yes      Incision types:  Stab incision    Incision depth:  Dermal    Wound management:  Probed and deloculated, irrigated with saline and extensive cleaning    Drainage:  Bloody and purulent    Drainage amount:  Copious    Wound treatment:  Wound left open    Packing material: 4x4 gauze used to keep wound open.  Post-procedure details:     Procedure completion:  Tolerated well, no immediate complications     No orders of the defined types were placed in this encounter.       Medications   HYDROmorphone HCl PF (DILAUDID) injection 1 mg (has no administration in time range)   ondansetron (ZOFRAN) injection 4 mg (has no administration in time range)   lidocaine-EPINEPHrine-tetracaine soln (has no administration in time range)   lidocaine 1 % injection 5 mL (has no administration in time range)       New  Prescriptions    No medications on file        No past medical history on file.     No past surgical history on file.     No results found for any visits on 04/22/22.     No orders to  display         Voice dictation software was used during the making of this note.  This software is not perfect and grammatical and other typographical errors may be present.  This note has not been completely proofread for errors.     Eliott Nine, DO  04/22/22 1905

## 2022-04-22 NOTE — ED Notes (Signed)
I have reviewed discharge instructions with the patient.  The patient verbalized understanding.    Patient left ED via Discharge Method: ambulatory to Home with mother.     Opportunity for questions and clarification provided.       Patient given 1 scripts.         To continue your aftercare when you leave the hospital, you may receive an automated call from our care team to check in on how you are doing.  This is a free service and part of our promise to provide the best care and service to meet your aftercare needs." If you have questions, or wish to unsubscribe from this service please call [phone].  Thank you for Choosing our [facility] Emergency Department.       Roderic Scarce, RN  04/22/22 1919

## 2022-04-22 NOTE — Discharge Instructions (Addendum)
Continue taking Bactrim twice daily and Keflex 4 times daily as prescribed.  Call your surgeon for which you were referred to.  Return to the emergency department for worsening or worrisome symptoms.  Alternate Tylenol and Motrin for pain.  Use Norco for severe, breakthrough pain.  Caution as this medication is sedating, do not drive or operate heavy machinery while taking Norco.
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
## 2022-04-25 ENCOUNTER — Inpatient Hospital Stay
Admit: 2022-04-25 | Discharge: 2022-04-25 | Disposition: A | Discharge status: Home / Self Care | Payer: BLUE CROSS/BLUE SHIELD | Attending: Emergency Medicine

## 2022-04-25 DIAGNOSIS — L0501 Pilonidal cyst with abscess: Secondary | ICD-10-CM

## 2022-04-25 MED ORDER — SODIUM CHLORIDE 0.9 % IV BOLUS
0.9 % | INTRAVENOUS | Status: AC
Start: 2022-04-25 — End: 2022-04-25
  Administered 2022-04-25: 21:00:00 1000 mL via INTRAVENOUS

## 2022-04-25 MED ORDER — HYDROMORPHONE HCL PF 1 MG/ML IJ SOLN
1 MG/ML | INTRAMUSCULAR | Status: AC
Start: 2022-04-25 — End: 2022-04-25
  Administered 2022-04-25: 21:00:00 1 mg via INTRAVENOUS

## 2022-04-25 MED ORDER — ONDANSETRON HCL 4 MG/2ML IJ SOLN
4 MG/2ML | INTRAMUSCULAR | Status: AC
Start: 2022-04-25 — End: 2022-04-25
  Administered 2022-04-25: 21:00:00 4 mg via INTRAVENOUS

## 2022-04-25 MED ORDER — CLINDAMYCIN PHOSPHATE IN D5W 600 MG/50ML IV SOLN
600 MG/50ML | INTRAVENOUS | Status: AC
Start: 2022-04-25 — End: 2022-04-25
  Administered 2022-04-25: 21:00:00 600 mg via INTRAVENOUS

## 2022-04-25 MED ORDER — CLINDAMYCIN HCL 150 MG PO CAPS
150 MG | ORAL_CAPSULE | Freq: Three times a day (TID) | ORAL | 0 refills | Status: AC
Start: 2022-04-25 — End: 2022-05-02

## 2022-04-25 MED FILL — HYDROMORPHONE HCL 1 MG/ML IJ SOLN: 1 mg/mL | INTRAMUSCULAR | Qty: 1

## 2022-04-25 MED FILL — CLINDAMYCIN PHOSPHATE IN D5W 600 MG/50ML IV SOLN: 600 MG/50ML | INTRAVENOUS | Qty: 50

## 2022-04-25 MED FILL — ONDANSETRON HCL 4 MG/2ML IJ SOLN: 4 MG/2ML | INTRAMUSCULAR | Qty: 2

## 2022-04-25 NOTE — ED Provider Notes (Addendum)
Emergency Department Provider Note       PCP: On File Not (Inactive)   Age: 21 y.o.   Sex: female     DISPOSITION       No diagnosis found.    Medical Decision Making     Complexity of Problems Addressed:  1 or more acute illnesses that pose a threat to life or bodily function.     Data Reviewed and Analyzed:  Category 1:   I independently ordered and reviewed each unique test.  I reviewed external records: ED visit note from an outside group.       Category 2:       Category 3: Discussion of management or test interpretation.  21 year old African-American female has had previous pilonidal abscesses.  She was seen in the emergency department 7/2 for suspected abscess.  At that time it was not felt to be large enough for I&D.  She was placed on antibiotics.  The abscess enlarged and she was seen again 7/6.  She had I&D performed.  She was prescribed pain medicine and antibiotics but has not filled those yet.  She returns via EMS due to worsening pain.  No fever or vomiting.     Physical exam  Well-nourished African-American female awake alert she is screaming in pain.  HEENT exam normocephalic atraumatic.  Cardiovascular regular rate and rhythm.  Lungs are clear.  Nodal exam patient has a very large fluctuant swelling in the pilonidal area consistent with abscess.    ED course  Patient treated with IV fluids, milligram of Dilaudid and 4 mg of Zofran.  Abscess has been drained and packed.  Patient treated with IV clindamycin.  She already has a prescription for hydrocodone from previous visit.  Will provide prescription for clindamycin.  I have also given surgery referral.          Risk of Complications and/or Morbidity of Patient Management:  Prescription drug management performed.  Shared medical decision making was utilized in creating the patients health plan today.         History      Debbie Barnes is a 21 y.o. female who presents to the Emergency Department with chief complaint of    Chief Complaint    Patient presents with    Tailbone Pain      HPI     Review of Systems    Physical Exam     Vitals signs and nursing note reviewed:  Vitals:    04/25/22 1645   BP: (!) 134/94   Pulse: 75   Resp: 20   Temp: 98.7 F (37.1 C)   TempSrc: Oral   SpO2: 99%   Weight: 180 lb (81.6 kg)   Height: 5\' 6"  (1.676 m)      Physical Exam     Procedures     Incision/Drainage    Date/Time: 04/25/2022 5:17 PM  Performed by: 06/26/2022, MD  Authorized by: Lance Sell, MD     Consent:     Consent obtained:  Verbal    Consent given by:  Patient    Risks discussed:  Pain  Location:     Type:  Abscess    Location:  Anogenital    Anogenital location:  Pilonidal  Pre-procedure details:     Skin preparation:  Povidone-iodine  Anesthesia:     Anesthesia method:  Local infiltration    Local anesthetic:  Lidocaine 1% WITH epi  Procedure type:     Complexity:  Simple  Procedure details:     Ultrasound guidance: no      Incision types:  Single straight    Incision depth:  Submucosal    Wound management:  Probed and deloculated    Drainage:  Purulent    Drainage amount:  Copious    Packing materials:  1/2 in iodoform gauze  Post-procedure details:     Procedure completion:  Tolerated    No orders of the defined types were placed in this encounter.       Medications   ondansetron (ZOFRAN) injection 4 mg (has no administration in time range)   HYDROmorphone HCl PF (DILAUDID) injection 1 mg (has no administration in time range)   0.9 % sodium chloride bolus (has no administration in time range)       New Prescriptions    No medications on file        No past medical history on file.     No past surgical history on file.     Social History     Socioeconomic History    Marital status: Single        Previous Medications    HYDROCODONE-ACETAMINOPHEN (NORCO) 7.5-325 MG PER TABLET    Take 1 tablet by mouth every 8 hours as needed for Pain for up to 3 days. Intended supply: 30 days Max Daily Amount: 3 tablets    IBUPROFEN (ADVIL;MOTRIN) 600 MG  TABLET    Take 600 mg by mouth every 6 hours as needed        No results found for any visits on 04/25/22.     No orders to display                     Voice dictation software was used during the making of this note.  This software is not perfect and grammatical and other typographical errors may be present.  This note has not been completely proofread for errors.     Lance Sell, MD  04/25/22 1718       Lance Sell, MD  04/25/22 1721

## 2022-04-25 NOTE — ED Notes (Signed)
I have reviewed discharge instructions with the patient.  The patient verbalized understanding.    Patient left ED via Discharge Method: ambulatory to Home with (Friend).    Opportunity for questions and clarification provided.       Patient given 1 scripts.         To continue your aftercare when you leave the hospital, you may receive an automated call from our care team to check in on how you are doing.  This is a free service and part of our promise to provide the best care and service to meet your aftercare needs." If you have questions, or wish to unsubscribe from this service please call [phone].  Thank you for Choosing our [facility] Emergency Department.        Gevena Barre, RN  04/25/22 1839

## 2022-04-25 NOTE — ED Triage Notes (Signed)
Patient a 21 y/o Female reports to the ED with complaint of a pilonidal cyst that was drained on 04/22/2022. Complaining of pain. And a burning sensation. Has not picked up antibiotics.

## 2022-04-27 MED ORDER — CLINDAMYCIN HCL 150 MG PO CAPS
150 MG | ORAL_CAPSULE | Freq: Three times a day (TID) | ORAL | 0 refills | Status: AC
Start: 2022-04-27 — End: 2022-05-04

## 2022-04-29 ENCOUNTER — Encounter: Payer: BLUE CROSS/BLUE SHIELD | Attending: Surgery | Primary: Sports Medicine
